# Patient Record
Sex: Male | Born: 1955
Health system: Southern US, Community
[De-identification: ages and names within clinical notes are randomized; demographics above are authoritative.]

## PROBLEM LIST (undated history)

## (undated) DIAGNOSIS — Z87442 Personal history of urinary calculi: Secondary | ICD-10-CM

## (undated) DIAGNOSIS — N200 Calculus of kidney: Secondary | ICD-10-CM

## (undated) DIAGNOSIS — G5600 Carpal tunnel syndrome, unspecified upper limb: Secondary | ICD-10-CM

## (undated) DIAGNOSIS — C61 Malignant neoplasm of prostate: Secondary | ICD-10-CM

## (undated) DIAGNOSIS — Z972 Presence of dental prosthetic device (complete) (partial): Secondary | ICD-10-CM

## (undated) DIAGNOSIS — C4490 Unspecified malignant neoplasm of skin, unspecified: Secondary | ICD-10-CM

## (undated) HISTORY — PX: PROSTATE BIOPSY: SHX241

## (undated) HISTORY — PX: SKIN CANCER EXCISION: SHX779

## (undated) HISTORY — PX: COLONOSCOPY: SHX174

---

## 1999-05-22 ENCOUNTER — Emergency Department (HOSPITAL_COMMUNITY): Admission: EM | Admit: 1999-05-22 | Discharge: 1999-05-22 | Payer: Self-pay | Admitting: *Deleted

## 1999-05-22 ENCOUNTER — Encounter: Payer: Self-pay | Admitting: Emergency Medicine

## 2001-03-01 ENCOUNTER — Emergency Department (HOSPITAL_COMMUNITY): Admission: EM | Admit: 2001-03-01 | Discharge: 2001-03-01 | Payer: Self-pay | Admitting: *Deleted

## 2001-03-01 ENCOUNTER — Encounter: Payer: Self-pay | Admitting: *Deleted

## 2007-10-18 ENCOUNTER — Emergency Department (HOSPITAL_COMMUNITY): Admission: EM | Admit: 2007-10-18 | Discharge: 2007-10-18 | Payer: Self-pay | Admitting: Emergency Medicine

## 2010-11-15 LAB — CBC
Hemoglobin: 14.9
RBC: 5.08
WBC: 16.1 — ABNORMAL HIGH

## 2010-11-15 LAB — BASIC METABOLIC PANEL
BUN: 20
CO2: 25
Calcium: 9.2
Chloride: 105
Creatinine, Ser: 1.63 — ABNORMAL HIGH
GFR calc Af Amer: 40 — ABNORMAL LOW
GFR calc non Af Amer: 33 — ABNORMAL LOW
Glucose, Bld: 142 — ABNORMAL HIGH
Potassium: 3.7
Sodium: 138

## 2010-11-15 LAB — URINALYSIS, ROUTINE W REFLEX MICROSCOPIC
Nitrite: NEGATIVE
Specific Gravity, Urine: >1.03 — ABNORMAL HIGH
Urobilinogen, UA: 0.2
pH: 5.5

## 2010-11-15 LAB — DIFFERENTIAL
Basophils Relative: 1
Lymphocytes Relative: 9 — ABNORMAL LOW
Monocytes Absolute: 0.8
Monocytes Relative: 5
Neutro Abs: 13.7 — ABNORMAL HIGH
Neutrophils Relative %: 85 — ABNORMAL HIGH

## 2010-11-15 LAB — URINE MICROSCOPIC-ADD ON

## 2011-07-13 ENCOUNTER — Emergency Department (INDEPENDENT_AMBULATORY_CARE_PROVIDER_SITE_OTHER): Payer: PRIVATE HEALTH INSURANCE

## 2011-07-13 ENCOUNTER — Emergency Department (HOSPITAL_COMMUNITY)
Admission: EM | Admit: 2011-07-13 | Discharge: 2011-07-13 | Disposition: A | Discharge status: Home / Self Care | Payer: PRIVATE HEALTH INSURANCE | Source: Home / Self Care | Attending: Emergency Medicine | Admitting: Emergency Medicine

## 2011-07-13 ENCOUNTER — Encounter (HOSPITAL_COMMUNITY): Payer: Self-pay | Admitting: Cardiology

## 2011-07-13 DIAGNOSIS — J069 Acute upper respiratory infection, unspecified: Secondary | ICD-10-CM

## 2011-07-13 MED ORDER — GUAIFENESIN-CODEINE 100-10 MG/5ML PO SYRP: 5.0000 mL | ORAL_SOLUTION | Freq: Three times a day (TID) | ORAL | Status: AC | PRN

## 2011-07-13 MED ORDER — ACETAMINOPHEN-CODEINE #3 300-30 MG PO TABS: 1.0000 | ORAL_TABLET | Freq: Four times a day (QID) | ORAL | Status: AC | PRN

## 2011-07-13 MED ORDER — IBUPROFEN 800 MG PO TABS
800.0000 mg | ORAL_TABLET | Freq: Once | ORAL | Status: AC
  Administered 2011-07-13: 800 mg via ORAL

## 2011-07-13 MED ORDER — IBUPROFEN 800 MG PO TABS
ORAL_TABLET | ORAL | Status: AC
  Filled 2011-07-13: qty 1

## 2011-07-13 NOTE — ED Provider Notes (Signed)
History     CSN: [id number]  Arrival date & time 07/13/11  0802   First MD Initiated Contact with Patient 07/13/11 0827      Chief Complaint  Patient presents with  . Fever  . Generalized Body Aches  . Cough    (Consider location/radiation/quality/duration/timing/severity/associated sxs/prior treatment) HPI Comments: Patient is reporting fevers and frequent coughing episodes since Wednesday. Continues to experience fevers over 102-103. Drinking fluids okay his appetite is somewhat gone does have some discomfort and soreness on his lower RIB cage both sides with when he coughs.  Patient without nausea vomiting or diarrhea as.  Patient is a 56 y.o. male presenting with fever and cough.  Fever Primary symptoms of the febrile illness include fever, fatigue, cough and arthralgias. Primary symptoms do not include visual change, wheezing, shortness of breath, abdominal pain, nausea, vomiting or rash. The current episode started 3 to 5 days ago. This is a new problem. The problem has not changed since onset. Cough Pertinent negatives include no shortness of breath and no wheezing.    History reviewed. No pertinent past medical history.  History reviewed. No pertinent past surgical history.  No family history on file.  History  Substance Use Topics  . Smoking status: Current Some Day Smoker -- 0.7 packs/day  . Smokeless tobacco: Not on file  . Alcohol Use: No    OB History    Grav Para Term Preterm Abortions TAB SAB Ect Mult Living                  Review of Systems  Constitutional: Positive for fever and fatigue. Negative for activity change and appetite change.  Respiratory: Positive for cough. Negative for shortness of breath and wheezing.   Gastrointestinal: Negative for nausea, vomiting and abdominal pain.  Musculoskeletal: Positive for arthralgias.  Skin: Negative for rash.    Allergies  Review of patient's allergies indicates no known allergies.  Home  Medications  No current outpatient prescriptions on file.  BP 122/76  Pulse 105  Temp(Src) 101.3 F (38.5 C) (Oral)  Resp 20  SpO2 97%  Physical Exam  Nursing note and vitals reviewed. Constitutional: Vital signs are normal. She appears well-developed and well-nourished.  Non-toxic appearance. She does not have a sickly appearance. She does not appear ill. No distress.  HENT:  Head: Normocephalic.  Pulmonary/Chest: Effort normal and breath sounds normal. No respiratory distress. She has no decreased breath sounds. She has no wheezes. She has no rhonchi. She has no rales. She exhibits no tenderness.    ED Course  Procedures (including critical care time) Patient looks comfortable nondisplaced but febrile. On obtaining x-rays patient is a smoker. Fever control with Motrin  MDM  Patient with respiratory symptoms and process to Wednesday. Not dyspneic.        Jimmie Molly, MD 07/13/11 [phone]

## 2011-07-13 NOTE — ED Notes (Addendum)
Pt reports Wednesday night he developed fever and frequent cough. Fever has been 102-103 responds to Tylenol. Tolerating PO intake. Pt reports soreness to lower rib cage bilat and abd from cough. Last dose of Tylenol at 1am today.

## 2011-07-13 NOTE — Discharge Instructions (Signed)
Your x-rays were normal. As discussed is likely your symptoms are a viral respiratory infection. I not expect it to still be 6 after 2 days try to hydrate yourself well dressed and treat any fevers above 100.8. Return if any further symptoms on his symptoms is one we discussed

## 2011-07-14 ENCOUNTER — Encounter (HOSPITAL_COMMUNITY): Payer: Self-pay | Admitting: Emergency Medicine

## 2011-07-14 ENCOUNTER — Emergency Department (HOSPITAL_COMMUNITY)
Admission: EM | Admit: 2011-07-14 | Discharge: 2011-07-14 | Disposition: A | Discharge status: Home / Self Care | Payer: PRIVATE HEALTH INSURANCE | Attending: Emergency Medicine | Admitting: Emergency Medicine

## 2011-07-14 ENCOUNTER — Emergency Department (HOSPITAL_COMMUNITY): Payer: PRIVATE HEALTH INSURANCE

## 2011-07-14 DIAGNOSIS — R509 Fever, unspecified: Secondary | ICD-10-CM | POA: Insufficient documentation

## 2011-07-14 DIAGNOSIS — R05 Cough: Secondary | ICD-10-CM | POA: Insufficient documentation

## 2011-07-14 DIAGNOSIS — R059 Cough, unspecified: Secondary | ICD-10-CM | POA: Insufficient documentation

## 2011-07-14 DIAGNOSIS — J209 Acute bronchitis, unspecified: Secondary | ICD-10-CM

## 2011-07-14 LAB — DIFFERENTIAL
Basophils Relative: 0 % (ref 0–1)
Eosinophils Absolute: 0 10*3/uL (ref 0.0–0.7)
Lymphocytes Relative: 7 % — ABNORMAL LOW (ref 12–46)
Monocytes Absolute: 0.8 10*3/uL (ref 0.1–1.0)
Neutrophils Relative %: 83 % — ABNORMAL HIGH (ref 43–77)
Smear Review: ADEQUATE

## 2011-07-14 MED ORDER — SODIUM CHLORIDE 0.9 % IV BOLUS (SEPSIS)
1000.0000 mL | Freq: Once | INTRAVENOUS | Status: AC
  Administered 2011-07-14: 1000 mL via INTRAVENOUS

## 2011-07-14 MED ORDER — DEXTROSE 5 % IV SOLN
1.0000 g | Freq: Once | INTRAVENOUS | Status: AC
  Administered 2011-07-14: 1 g via INTRAVENOUS
  Filled 2011-07-14: qty 10

## 2011-07-14 MED ORDER — ACETAMINOPHEN 500 MG PO TABS
1000.0000 mg | ORAL_TABLET | Freq: Once | ORAL | Status: AC
  Administered 2011-07-14: 1000 mg via ORAL
  Filled 2011-07-14: qty 2

## 2011-07-14 MED ORDER — AZITHROMYCIN 250 MG PO TABS: ORAL_TABLET | ORAL | Status: AC

## 2011-07-14 NOTE — Discharge Instructions (Signed)
Acute Bronchitis You have acute bronchitis. This means you have a chest cold. The airways in your lungs are red and sore (inflamed). Acute means it is sudden onset.  CAUSES Bronchitis is most often caused by the same virus that causes a cold. SYMPTOMS   Body aches.   Chest congestion.   Chills.   Cough.   Fever.   Shortness of breath.   Sore throat.  TREATMENT  Acute bronchitis is usually treated with rest, fluids, and medicines for relief of fever or cough. Most symptoms should go away after a few days or a week. Increased fluids may help thin your secretions and will prevent dehydration. Your caregiver may give you an inhaler to improve your symptoms. The inhaler reduces shortness of breath and helps control cough. You can take over-the-counter pain relievers or cough medicine to decrease coughing, pain, or fever. A cool-air vaporizer may help thin bronchial secretions and make it easier to clear your chest. Antibiotics are usually not needed but can be prescribed if you smoke, are seriously ill, have chronic lung problems, are elderly, or you are at higher risk for developing complications.Allergies and asthma can make bronchitis worse. Repeated episodes of bronchitis may cause longstanding lung problems. Avoid smoking and secondhand smoke.Exposure to cigarette smoke or irritating chemicals will make bronchitis worse. If you are a cigarette smoker, consider using nicotine gum or skin patches to help control withdrawal symptoms. Quitting smoking will help your lungs heal faster. Recovery from bronchitis is often slow, but you should start feeling better after 2 to 3 days. Cough from bronchitis frequently lasts for 3 to 4 weeks. To prevent another bout of acute bronchitis:  Quit smoking.   Wash your hands frequently to get rid of viruses or use a hand sanitizer.   Avoid other people with cold or virus symptoms.   Try not to touch your hands to your mouth, nose, or eyes.  SEEK  IMMEDIATE MEDICAL CARE IF:  You develop increased fever, chills, or chest pain.   You have severe shortness of breath or bloody sputum.   You develop dehydration, fainting, repeated vomiting, or a severe headache.   You have no improvement after 1 week of treatment or you get worse.  MAKE SURE YOU:   Understand these instructions.   Will watch your condition.   Will get help right away if you are not doing well or get worse.  Document Released: 03/08/2004 Document Revised: 01/18/2011 Document Reviewed: 05/24/2010 [facility] Patient Information 2012 [place], [place].   Start your Zithromax today.  Rest and make sure you're drinking plenty of fluids.  Treat your fever with Tylenol every 6 hours, you can take Motrin 3 hours between your Tylenol doses if your fever remains.  Please get rechecked if you are not feeling better or if your symptoms worsen in any way over the next several days.  See the resource guide below for information regarding a primary medical doctor.  Stop smoking.    RESOURCE GUIDE  Chronic Pain Problems: Contact [facility]  [phone] Patients need to be referred by their primary care doctor.  Insufficient Money for Medicine: Contact United Way:  call "211" or [facility] [phone].  No Primary Care Doctor: - Call [facility]  [phone] - can help you locate a primary care doctor that  accepts your insurance, provides certain services, etc. - Physician Referral [phone]  Agencies that provide inexpensive medical care: - [facility]  [phone] - Patrcia Dolly  [facility]  [phone] - [facility]  [phone] - Women's Clinic  [phone] - Planned Parenthood  [phone] [facility]  [phone]  Medicaid-accepting [facility] Providers: - [facility]- [address]  [phone], Mon-Fri 9am-7pm, Sat 9am-1pm - [facility]- [address] [place], [address] [place]  [phone] - [facility]- [address], [address] [place]  [phone] [facility]- [address]  [phone] - Renaye Rakers- [address] [place], [address], [phone]  Only accepts [place] patients after they have their name  applied to their card  Self Pay (no insurance) in [place]: - Sickle Cell Patients: Dr Willey Blade, [facility]  [address] [place], [phone] - [facility]- [address] [place]  [phone]       [facility]- [address]       -     [facility]- see information above (Speak to [employer] if you do not have insurance)       -  [facility]- [address] [place], [phone]       -  [facility]- [address],  [phone]       -  [facility]- [address] [phone]       -  Dr Julio Sicks-  [address], [place], [phone]       -  [facility]- [address], [phone]       -  [facility]- [address] [phone], also [address] [phone]       -    [facility]- [address] [place], [phone], 1st & 3rd Saturday   every month, 10am-1pm  1) Find a Doctor and Pay Out of Pocket Although you won't have to find out who is covered by your insurance plan, it is a good idea to ask around and get recommendations. You will then need to call the office and see if the doctor you have chosen will accept you as a new patient and what types of options they offer for patients who are self-pay. Some doctors offer discounts or will set up payment plans for their patients who do not have insurance, but you will need to ask so you aren't surprised when you get to your appointment.  2) Contact Your Local Health Department Not all health departments have doctors that can see patients for sick visits, but many do, so it is worth a  call to see if yours does. If you don't know where your local health department is, you can check in your phone book. The CDC also has a tool to help you locate your state's health department, and many state websites also have listings of all of their local health departments.  3) Find a Walk-in Clinic If your illness is not likely to be very severe or complicated, you may want to try a walk in clinic. These are popping up all over the country in pharmacies, drugstores, and shopping centers. They're usually staffed by nurse practitioners or physician assistants that have been trained to treat common illnesses and complaints. They're usually fairly quick and inexpensive. However, if you have serious medical issues or chronic medical problems, these are probably not  your best option  STD Testing - [facility] Department of [facility] [place], STD Clinic, [address] [place], phone [phone] or [phone].  Monday - Friday, call for an appointment. [facility] Department of [employer], STD Clinic, [address], [place], phone [phone] or [phone].  Monday - Friday, call for an appointment.  Abuse/Neglect: [facility] Child Abuse Hotline 413-579-0724 [facility] Child Abuse Hotline 206-154-2811 (After Hours)  Emergency Shelter:  [facility] 701-794-2117  Maternity Homes: - [facility] [place] [facility] 475-305-4569 - [facility] [phone]  MRSA Hotline #:   813-299-3237  [facility] Resources  [facility]  [employer] [facility] Dept. [address]                 [address]         [address] [place] [address]  Blondell Reveal Phone:  [phone]                                  Phone:  [phone]                   Phone:  [phone]  [facility],  [phone] - [phone]       -     [facility] in [place], [address]                                  [phone], [facility] Child Abuse Hotline 3645082720 or 856-163-6243 (After Hours)   Behavioral Health Services  Substance Abuse Resources: - [facility] and Drug Services  520-011-2250 - [facility] 442-325-9346 - [facility] [place] [phone] Floydene Flock 803-213-7804 - Residential & Outpatient Substance Abuse Program  [phone]  Psychological Services: [facility]  [phone] Services  [phone] - [facility], [phone] [place]. [address] [place], ACCESS LINE: 437-716-3971 or 564-852-6644, [employer]  Dental Assistance  If unable to pay or uninsured, contact:  [facility] or [facility] to become qualified for the adult dental clinic.  Patients with Medicaid: [facility] [phone] [address], [phone] [address], [phone]  If unable to pay, or uninsured, contact [facility] [phone])719-1274) or [facility] Department [phone] in [place], [phone] in [facility]) to become qualified for the adult dental clinic  Other Low-Cost Community Dental Services: - [facility]- [address] [place], [place], [place], [address], [phone], [phone], 2nd and 4th Thursday of the month at 6:30am.  10 clients each day by appointment, can sometimes see walk-in patients if someone does not show for an appointment. [facility]- [address] [place], [place], [address], [phone] -  [facility]- [address] [place], [place], [address], [phone] - [facility]- [phone] [facility] Department- [phone] [facility] Department- [phone]

## 2011-07-14 NOTE — ED Notes (Signed)
Pt c/o cough and fever 103.8 since Wednesday.

## 2011-07-16 LAB — CBC
Hemoglobin: 13.7 g/dL (ref 13.0–17.0)
MCH: 28.7 pg (ref 26.0–34.0)
MCHC: 34.3 g/dL (ref 30.0–36.0)
Platelets: 208 10*3/uL (ref 150–400)
RBC: 4.77 MIL/uL (ref 4.22–5.81)

## 2011-07-16 LAB — BASIC METABOLIC PANEL
Calcium: 8.7 mg/dL (ref 8.4–10.5)
Creatinine, Ser: 1.13 mg/dL (ref 0.50–1.35)
GFR calc Af Amer: 62 mL/min — ABNORMAL LOW (ref >90)
GFR calc non Af Amer: 53 mL/min — ABNORMAL LOW (ref >90)
Sodium: 131 mEq/L — ABNORMAL LOW (ref 135–145)

## 2011-07-16 NOTE — ED Provider Notes (Signed)
History     CSN: [id number]  Arrival date & time 07/14/11  4098   First MD Initiated Contact with Patient 07/14/11 0957      Chief Complaint  Patient presents with  . Cough  . Fever    (Consider location/radiation/quality/duration/timing/severity/associated sxs/prior treatment) HPI Comments: JERRELL MANGEL presents for evaluation of productive cough and fever for the past 3 days.  He has had fever to 103.8 which has been waxing and waning.  He has green purulent sputum without hemoptysis.  He denies shortness of breath,  But has been very fatigued and has difficulty sleeping due to the frequency of cough.  He was [facility] and placed on tylenol #3 and robitussin AC but feels no better.  He reports bilateral anterior rib soreness from coughing.  He denies nausea,  Vomiting and abdominal pain.He has been maintaining fluid intake, but has had reduced appetite.  Patient is a 56 y.o. male presenting with cough and fever. The history is provided by the patient and the spouse.  Cough Pertinent negatives include no chest pain, no headaches, no sore throat and no shortness of breath.  Fever Primary symptoms of the febrile illness include fever and cough. Primary symptoms do not include headaches, shortness of breath, abdominal pain, nausea, arthralgias or rash.    History reviewed. No pertinent past medical history.  History reviewed. No pertinent past surgical history.  History reviewed. No pertinent family history.  History  Substance Use Topics  . Smoking status: Current Some Day Smoker -- 0.7 packs/day    Types: Cigarettes  . Smokeless tobacco: Not on file  . Alcohol Use: No      Review of Systems  Constitutional: Positive for fever.  HENT: Negative for congestion, sore throat and neck pain.   Eyes: Negative.   Respiratory: Positive for cough. Negative for chest tightness and shortness of breath.   Cardiovascular: Negative for chest pain and palpitations.    Gastrointestinal: Negative for nausea and abdominal pain.  Genitourinary: Negative.   Musculoskeletal: Negative for joint swelling and arthralgias.  Skin: Negative.  Negative for rash and wound.  Neurological: Negative for dizziness, weakness, light-headedness, numbness and headaches.  Hematological: Negative.   Psychiatric/Behavioral: Negative.     Allergies  Review of patient's allergies indicates no known allergies.  Home Medications   Current Outpatient Rx  Name Route Sig Dispense Refill  . ACETAMINOPHEN-CODEINE #3 300-30 MG PO TABS Oral Take 1-2 tablets by mouth every 6 (six) hours as needed for pain. 15 tablet 0  . GUAIFENESIN-CODEINE 100-10 MG/5ML PO SYRP Oral Take 5 mLs by mouth 3 (three) times daily as needed for cough. 120 mL 0  . IBUPROFEN 200 MG PO TABS Oral Take 400-800 mg by mouth every 4 (four) hours as needed. For fever and pain    . AZITHROMYCIN 250 MG PO TABS  Take 2 tablets by mouth on day one followed by one tablet daily for 4 days. 6 each 0    BP 125/71  Pulse 102  Temp(Src) 100 F (37.8 C) (Oral)  Resp 20  Ht 5\' 10"  (1.778 m)  Wt 165 lb (74.844 kg)  BMI 23.68 kg/m2  SpO2 94%  Physical Exam  Nursing note and vitals reviewed. Constitutional: He appears well-developed and well-nourished.  HENT:  Head: Normocephalic and atraumatic.  Eyes: Conjunctivae are normal.  Neck: Normal range of motion.  Cardiovascular: Normal rate, regular rhythm, normal heart sounds and intact distal pulses.   Pulmonary/Chest: Effort normal and  breath sounds normal. No respiratory distress. He has no wheezes. He has no rhonchi. He has no rales. He exhibits tenderness.  Abdominal: Soft. Bowel sounds are normal. There is no tenderness.  Musculoskeletal: Normal range of motion.  Neurological: He is alert.  Skin: Skin is warm and dry.  Psychiatric: He has a normal mood and affect.    ED Course  Procedures (including critical care time)  Labs Reviewed  DIFFERENTIAL -  Abnormal; Notable for the following:    Neutrophils Relative 83 (*)    Lymphocytes Relative 7 (*)    Lymphs Abs 0.6 (*)    All other components within normal limits  BASIC METABOLIC PANEL - Abnormal; Notable for the following:    Sodium 131 (*)    Glucose, Bld 107 (*)    Creatinine, Ser 1.13 (*)    GFR calc non Af Amer 53 (*)    GFR calc Af Amer 62 (*)    All other components within normal limits  CBC  LAB REPORT - SCANNED   No results found.   1. Bronchitis, acute    Patient given rocephin 1 gram IV in ed.  Prescribed zithromax,   MDM  Acute bronchitis in a chronic smoker - pt covered with rocephin,  zithromax .  Given IV fluids,  Fever responded appropriately to tylenol.  Encouraged rest, increase fluids.  Tylenol/motrin for fever reduction,  Recheck by pcp or return here for any worsened sx,  Or if not improving with tx.        Burgess Amor, [place] 07/16/11 2312

## 2011-07-17 NOTE — ED Provider Notes (Signed)
Medical screening examination/treatment/procedure(s) were performed by non-physician practitioner and as supervising physician I was immediately available for consultation/collaboration.   Dayton Bailiff, MD 07/17/11 [phone]

## 2013-12-24 ENCOUNTER — Encounter (HOSPITAL_COMMUNITY): Payer: Self-pay | Admitting: Emergency Medicine

## 2013-12-24 ENCOUNTER — Emergency Department (INDEPENDENT_AMBULATORY_CARE_PROVIDER_SITE_OTHER): Payer: Self-pay

## 2013-12-24 ENCOUNTER — Emergency Department (HOSPITAL_COMMUNITY)
Admission: EM | Admit: 2013-12-24 | Discharge: 2013-12-24 | Disposition: A | Discharge status: Home / Self Care | Payer: Self-pay | Source: Home / Self Care | Attending: Family Medicine | Admitting: Family Medicine

## 2013-12-24 DIAGNOSIS — T148 Other injury of unspecified body region: Secondary | ICD-10-CM

## 2013-12-24 DIAGNOSIS — S62639B Displaced fracture of distal phalanx of unspecified finger, initial encounter for open fracture: Secondary | ICD-10-CM

## 2013-12-24 DIAGNOSIS — IMO0002 Reserved for concepts with insufficient information to code with codable children: Secondary | ICD-10-CM

## 2013-12-24 DIAGNOSIS — X58XXXA Exposure to other specified factors, initial encounter: Secondary | ICD-10-CM

## 2013-12-24 MED ORDER — KETOROLAC TROMETHAMINE 60 MG/2ML IM SOLN
60.0000 mg | Freq: Once | INTRAMUSCULAR | Status: AC
  Administered 2013-12-24: 60 mg via INTRAMUSCULAR

## 2013-12-24 MED ORDER — CEPHALEXIN 500 MG PO CAPS: 500.0000 mg | ORAL_CAPSULE | Freq: Three times a day (TID) | ORAL | Status: DC

## 2013-12-24 MED ORDER — LIDOCAINE HCL (PF) 2 % IJ SOLN
INTRAMUSCULAR | Status: AC
  Filled 2013-12-24: qty 2

## 2013-12-24 MED ORDER — KETOROLAC TROMETHAMINE 60 MG/2ML IM SOLN
INTRAMUSCULAR | Status: AC
  Filled 2013-12-24: qty 2

## 2013-12-24 NOTE — ED Notes (Signed)
58 year old male smashed his index finger at approx  11:00am today between a trailer and the Marketing executive.   Laceration to end of the Right index finger.

## 2013-12-24 NOTE — Discharge Instructions (Signed)
You have a tuft fracture of your finger.  The laceration was repaired with 2 deep sutures that will dissolve and 10 superficial sutures that will need to be removed in 14 days Until that time please wash your finger with soap and water daily and apply antibiotic ointment and a bandage until the stitches come out Please take the antibiotics Please wear the finger brace for the next 4-6 weeks. Than let pain be your guide on further wearing of the brace.

## 2013-12-24 NOTE — ED Provider Notes (Signed)
CSN: [id number]     Arrival date & time 12/24/13  1537 History   First MD Initiated Contact with Patient 12/24/13 1612     Chief Complaint  Patient presents with  . Extremity Laceration   (Consider location/radiation/quality/duration/timing/severity/associated sxs/prior Treatment) HPI  R index finger laceration and crush injury: 11:30 today pt was unhooking trailer from truck and smashed finger inside [employer]. Heavy load in trailer. Immediately painful and bleeding. Applied abx ointment. 1000mg  tylenol w/o much relief. Movement and sensation intact.   History reviewed. No pertinent past medical history. History reviewed. No pertinent past surgical history. History reviewed. No pertinent family history. History  Substance Use Topics  . Smoking status: Current Some Day Smoker -- 0.75 packs/day    Types: Cigarettes  . Smokeless tobacco: Not on file  . Alcohol Use: No    Review of Systems Per HPI with all other pertinent systems negative.   Allergies  Review of patient's allergies indicates no known allergies.  Home Medications   Prior to Admission medications   Medication Sig Start Date End Date Taking? Authorizing Provider  cephALEXin (KEFLEX) 500 MG capsule Take 1 capsule (500 mg total) by mouth 3 (three) times daily. 12/24/13   Waldemar Dickens, MD  ibuprofen (ADVIL,MOTRIN) 200 MG tablet Take 400-800 mg by mouth every 4 (four) hours as needed. For fever and pain    Historical Provider, MD   BP 123/79 mmHg  Pulse 78  Temp(Src) 97.2 F (36.2 C) (Oral)  Resp 16  SpO2 100% Physical Exam  Constitutional: He appears well-developed and well-nourished.  HENT:  Head: Normocephalic and atraumatic.  Eyes: EOM are normal. Pupils are equal, round, and reactive to light.  Neck: Normal range of motion.  Cardiovascular: Normal rate, normal heart sounds and intact distal pulses.   No murmur heard. Pulmonary/Chest: Effort normal and breath sounds normal.  Abdominal: Soft. Bowel  sounds are normal.  Musculoskeletal: Normal range of motion.  Index finger w/ complete flexion and extension  Neurological: He is alert.  Skin:  Large 3.8cm laceration along the volar and lateral aspect of the R index finger and medial subungual hematoma  (anterilor 1/4th)   Psychiatric: He has a normal mood and affect. His behavior is normal. Judgment and thought content normal.    ED Course  LACERATION REPAIR Date/Time: 12/24/2013 6:01 PM Performed by: Marily Memos, DAVID J Authorized by: Marily Memos, DAVID J Consent: Verbal consent obtained. Risks and benefits: risks, benefits and alternatives were discussed Consent given by: patient Patient identity confirmed: verbally with patient Body area: upper extremity Location details: right index finger Laceration length: 3.8 cm Contamination: The wound is contaminated. Foreign bodies: no foreign bodies Tendon involvement: none Nerve involvement: none Vascular damage: no Anesthesia: digital block Local anesthetic: lidocaine 1% without epinephrine Anesthetic total: 5 ml Patient sedated: no Preparation: Patient was prepped and draped in the usual sterile fashion. Irrigation solution: saline (betadine) Amount of cleaning: extensive Debridement: minimal Degree of undermining: none Skin closure: 3-0 Prolene Subcutaneous closure: 3-0 Vicryl Number of sutures: 10 Technique: simple Approximation: close Approximation difficulty: complex Dressing: antibiotic ointment and gauze roll Patient tolerance: Patient tolerated the procedure well with no immediate complications   (including critical care time) Labs Review Labs Reviewed - No data to display  Imaging Review Dg Hand Complete Right  12/24/2013   CLINICAL DATA:  Per pt: unhooking a trailer and smashed right index finger. No prior injury to the right hand. Right index finger has a laceration distally. Patient is not a  diabetic.  EXAM: RIGHT HAND - COMPLETE 3+ VIEW  COMPARISON:  None.   FINDINGS: There is a comminuted fracture of the tuft of the distal phalanx of the index finger. Associated soft tissue irregularity is noted. There is soft tissue edema without obvious radiopaque foreign body. No other fractures. Degenerative change in distal interphalangeal joint of the third digit.  IMPRESSION: Soft tissue laceration associated with tuft fracture of the distal phalanx of the index finger.   Electronically Signed   By: Shon Hale M.D.   On: 12/24/2013 16:31     MDM   1. Open fracture of tuft of distal phalanx of finger, initial encounter   2. Crush accident   3. Laceration    Tuft fracture and laceration Repair as above Brace for 4-6 wks  Toradol 60mg  IM in office Ice, elevation, and NSAIDs (start in 24 hrs) Precautions given and all questions answered Keflex x 7 days  Linna Darner, MD Family Medicine 12/24/2013, 6:03 PM       Waldemar Dickens, MD 12/24/13 [phone]

## 2014-01-08 ENCOUNTER — Emergency Department (INDEPENDENT_AMBULATORY_CARE_PROVIDER_SITE_OTHER)
Admission: EM | Admit: 2014-01-08 | Discharge: 2014-01-08 | Disposition: A | Discharge status: Home / Self Care | Payer: Self-pay | Source: Home / Self Care | Attending: Emergency Medicine | Admitting: Emergency Medicine

## 2014-01-08 ENCOUNTER — Encounter (HOSPITAL_COMMUNITY): Payer: Self-pay | Admitting: Emergency Medicine

## 2014-01-08 DIAGNOSIS — IMO0002 Reserved for concepts with insufficient information to code with codable children: Secondary | ICD-10-CM

## 2014-01-08 DIAGNOSIS — S62639D Displaced fracture of distal phalanx of unspecified finger, subsequent encounter for fracture with routine healing: Secondary | ICD-10-CM

## 2014-01-08 DIAGNOSIS — T148XXA Other injury of unspecified body region, initial encounter: Secondary | ICD-10-CM

## 2014-01-08 DIAGNOSIS — T148 Other injury of unspecified body region: Secondary | ICD-10-CM

## 2014-01-08 MED ORDER — LIDOCAINE HCL (PF) 2 % IJ SOLN
INTRAMUSCULAR | Status: AC
  Filled 2014-01-08: qty 2

## 2014-01-08 NOTE — Discharge Instructions (Signed)
Wash with soap and water and apply antibiotic ointment and Band Aid.  Continue to wear splint.

## 2014-01-08 NOTE — ED Provider Notes (Signed)
  Chief Complaint   Suture / Staple Removal   History of Present Illness   [name] is a 58 year old male who returns today for recheck on a laceration to the tip of his right index finger. He sustained a crush injury to the finger. This resulted in a tuft fracture and laceration, therefore this was an open fracture. He also had a subungual hematoma. The subungual hematoma was drained, the laceration was repaired with 10 sutures, and he was placed on Keflex. He's done well since then. This was 2 weeks ago, so the sutures are ready to come out. He denies any swelling. The tip of the finger is still sensitive.  Review of Systems   Other than as noted above, the patient denies any of the following symptoms: Systemic:  No fevers or chills. Musculoskeletal:  No joint pain or arthritis.  Neurological:  No muscular weakness or paresthesias.  [name]   Past medical history, family history, social history, meds, and allergies were reviewed.     Physical Examination   Vital signs:  BP 138/73 mmHg  Pulse 70  Temp(Src) 97.9 F (36.6 C) (Oral)  Resp 16  SpO2 98% Gen:  Alert and in no distress. Musculoskeletal:  Exam of the hand reveals he has a sutured laceration on the tip of the right index finger with 10 sutures in place. It appears to be healing well with no evidence of infection. The tip of the finger still very sensitive and painful to touch, but there is no swelling, erythema, or visible collection of pus.  Otherwise, all joints had a full a ROM with no swelling, bruising or deformity.  No edema, pulses full. Extremities were warm and pink.  Capillary refill was brisk.  Skin:  Clear, warm and dry.  No rash. Neuro:  Alert and oriented.  Muscle strength was normal.  Sensation was intact to light touch.   Course in Urgent [facility] Center   I attempted to remove the sutures without anesthesia, but he was just too sensitive on the tip of the finger. Therefore the finger was anesthetized  with a digital block with 5 mL of 2% Xylocaine with epinephrine. Thereafter he had complete anesthesia in the tip of the finger and all the stitches could be removed. This was then cleansed and covered with an tobacco ointment and a Band-Aid dressing. He's to continue on with wound care, where his finger splint, and return again in a month for one final recheck.  Assessment   The primary encounter diagnosis was Laceration. Diagnoses of Crush injury and Open fracture of finger, distal phalanx, with routine healing, subsequent encounter were also pertinent to this visit.  Plan  1.  Meds:  The following meds were prescribed:   Discharge Medication List as of 01/08/2014  4:10 PM      2.  Patient Education/Counseling:  The patient was given appropriate handouts, self care instructions, and instructed in symptomatic relief, including rest and activity, and elevation.   3.  Follow up:  The patient was told to follow up here if no better in 3 to 4 days, or sooner if becoming worse in any way, and given some red flag symptoms such as worsening pain, fever, swelling, or neurological symptoms which would prompt immediate return.        Harden Mo, MD 01/08/14 [phone]

## 2014-01-08 NOTE — ED Notes (Signed)
Patient presents for suture removal 2 weeks post procedure. Patient denies any new changes. He is alert and oriented and in NAD.

## 2014-03-17 ENCOUNTER — Emergency Department (HOSPITAL_COMMUNITY)
Admission: EM | Admit: 2014-03-17 | Discharge: 2014-03-17 | Disposition: A | Discharge status: Home / Self Care | Payer: 59 | Attending: Emergency Medicine | Admitting: Emergency Medicine

## 2014-03-17 ENCOUNTER — Emergency Department (HOSPITAL_COMMUNITY): Payer: 59

## 2014-03-17 ENCOUNTER — Encounter (HOSPITAL_COMMUNITY): Payer: Self-pay | Admitting: *Deleted

## 2014-03-17 DIAGNOSIS — Z720 Tobacco use: Secondary | ICD-10-CM | POA: Insufficient documentation

## 2014-03-17 DIAGNOSIS — N200 Calculus of kidney: Secondary | ICD-10-CM | POA: Insufficient documentation

## 2014-03-17 DIAGNOSIS — R109 Unspecified abdominal pain: Secondary | ICD-10-CM | POA: Diagnosis present

## 2014-03-17 DIAGNOSIS — Z792 Long term (current) use of antibiotics: Secondary | ICD-10-CM | POA: Diagnosis not present

## 2014-03-17 LAB — URINALYSIS, ROUTINE W REFLEX MICROSCOPIC
BILIRUBIN URINE: NEGATIVE
Glucose, UA: NEGATIVE mg/dL
KETONES UR: NEGATIVE mg/dL
Leukocytes, UA: NEGATIVE
NITRITE: NEGATIVE
PROTEIN: NEGATIVE mg/dL
Specific Gravity, Urine: 1.02 (ref 1.005–1.030)
UROBILINOGEN UA: 0.2 mg/dL (ref 0.0–1.0)
pH: 5.5 (ref 5.0–8.0)

## 2014-03-17 LAB — URINE MICROSCOPIC-ADD ON

## 2014-03-17 MED ORDER — KETOROLAC TROMETHAMINE 10 MG PO TABS: 10.0000 mg | ORAL_TABLET | Freq: Four times a day (QID) | ORAL | Status: DC | PRN

## 2014-03-17 MED ORDER — KETOROLAC TROMETHAMINE 30 MG/ML IJ SOLN
30.0000 mg | Freq: Once | INTRAMUSCULAR | Status: AC
  Administered 2014-03-17: 30 mg via INTRAVENOUS
  Filled 2014-03-17: qty 1

## 2014-03-17 MED ORDER — MORPHINE SULFATE 4 MG/ML IJ SOLN
4.0000 mg | Freq: Once | INTRAMUSCULAR | Status: AC
  Administered 2014-03-17: 4 mg via INTRAVENOUS
  Filled 2014-03-17: qty 1

## 2014-03-17 MED ORDER — SODIUM CHLORIDE 0.9 % IV BOLUS (SEPSIS)
500.0000 mL | Freq: Once | INTRAVENOUS | Status: AC
  Administered 2014-03-17: 500 mL via INTRAVENOUS

## 2014-03-17 MED ORDER — ONDANSETRON HCL 4 MG/2ML IJ SOLN
4.0000 mg | Freq: Once | INTRAMUSCULAR | Status: AC
  Administered 2014-03-17: 4 mg via INTRAVENOUS
  Filled 2014-03-17: qty 2

## 2014-03-17 MED ORDER — OXYCODONE-ACETAMINOPHEN 5-325 MG PO TABS: 2.0000 | ORAL_TABLET | ORAL | Status: DC | PRN

## 2014-03-17 NOTE — ED Notes (Addendum)
Pt reporting last week was diagnosed with kidney stones.  Reporting increased pain in past 24 hours.  Reporting decrease in urination.

## 2014-03-17 NOTE — Discharge Instructions (Signed)
Refill for pain prescription and Toradol which is also helpful for pain.  Referral to urologist. Phone number given.

## 2014-03-17 NOTE — ED Notes (Signed)
Patient verbalizes understanding of discharge instructions, prescription medications, home care and follow up care. Patient ambulatory out of department at this time with family member. 

## 2014-03-18 NOTE — ED Provider Notes (Signed)
CSN: [id number]     Arrival date & time 03/17/14  1936 History   First MD Initiated Contact with Patient 03/17/14 2049     Chief Complaint  Patient presents with  . Nephrolithiasis     (Consider location/radiation/quality/duration/timing/severity/associated sxs/prior Treatment) HPI.... Patient with known history of kidney stones presents with persistent left flank pain for several days. He was seen in the [facility] emergency department and diagnosed via CT scan. No fever, chills, dysuria, hematuria. He has been taking Percocet for pain. He has not seen a urologist.  Past Medical History  Diagnosis Date  . Renal disorder    History reviewed. No pertinent past surgical history. History reviewed. No pertinent family history. History  Substance Use Topics  . Smoking status: Current Some Day Smoker -- 0.75 packs/day    Types: Cigarettes  . Smokeless tobacco: Not on file  . Alcohol Use: No    Review of Systems  All other systems reviewed and are negative.     Allergies  Review of patient's allergies indicates no known allergies.  Home Medications   Prior to Admission medications   Medication Sig Start Date End Date Taking? Authorizing Provider  cyclobenzaprine (FLEXERIL) 10 MG tablet Take 10 mg by mouth 3 (three) times daily as needed for muscle spasms.   Yes Historical Provider, MD  ibuprofen (ADVIL,MOTRIN) 200 MG tablet Take 400-800 mg by mouth every 4 (four) hours as needed. For fever and pain   Yes Historical Provider, MD  ondansetron (ZOFRAN-ODT) 4 MG disintegrating tablet Take 4 mg by mouth every 6 (six) hours as needed for nausea or vomiting.   Yes Historical Provider, MD  tamsulosin (FLOMAX) 0.4 MG CAPS capsule Take 0.4 mg by mouth.   Yes Historical Provider, MD  cephALEXin (KEFLEX) 500 MG capsule Take 1 capsule (500 mg total) by mouth 3 (three) times daily. 12/24/13   Waldemar Dickens, MD  ketorolac (TORADOL) 10 MG tablet Take 1 tablet (10 mg total) by  mouth every 6 (six) hours as needed for moderate pain. 03/17/14   Nat Christen, MD  oxyCODONE-acetaminophen (PERCOCET) 5-325 MG per tablet Take 2 tablets by mouth every 4 (four) hours as needed. 03/17/14   Nat Christen, MD   BP 121/81 mmHg  Pulse 79  Temp(Src) 98 F (36.7 C) (Oral)  Resp 18  Ht 5\' 9"  (1.753 m)  Wt 160 lb (72.576 kg)  BMI 23.62 kg/m2  SpO2 98% Physical Exam  Constitutional: He is oriented to person, place, and time. He appears well-developed and well-nourished.  HENT:  Head: Normocephalic and atraumatic.  Eyes: Conjunctivae and EOM are normal. Pupils are equal, round, and reactive to light.  Neck: Normal range of motion. Neck supple.  Cardiovascular: Normal rate and regular rhythm.   Pulmonary/Chest: Effort normal and breath sounds normal.  Abdominal: Soft. Bowel sounds are normal.  Genitourinary:  Minimal left flank tenderness  Musculoskeletal: Normal range of motion.  Neurological: He is alert and oriented to person, place, and time.  Skin: Skin is warm and dry.  Psychiatric: He has a normal mood and affect. His behavior is normal.  Nursing note and vitals reviewed.   ED Course  Procedures (including critical care time) Labs Review Labs Reviewed  URINALYSIS, ROUTINE W REFLEX MICROSCOPIC - Abnormal; Notable for the following:    Hgb urine dipstick SMALL (*)    All other components within normal limits  URINE MICROSCOPIC-ADD ON    Imaging Review Dg Abd 1 View  03/17/2014   CLINICAL  DATA:  Nephrolithiasis. Left-sided abdominal pain for 3 days pre decreased urination. History of kidney stones. Initial encounter.  EXAM: ABDOMEN - 1 VIEW  COMPARISON:  CT 10/18/2007  FINDINGS: Multiple calcifications in the left upper quadrant of the abdomen corresponds to splenic granulomas on prior CT. No definite calcifications project over the renal shadow. There are pelvic calcifications, right greater than left, correspond to phleboliths on prior CT. No definite stones over the  course of either ureter. A 13 mm calcification in the right upper quadrant the abdomen is likely a gallstone. Mild gaseous distention of transverse colon, which is otherwise normal in caliber. There are no dilated bowel loops to suggest obstruction. There is no evidence of free intra-abdominal air. No acute osseous abnormalities are seen.  IMPRESSION: 1. No definite urolithiasis identified on radiograph. 2. There are multiple calcifications in the abdomen. Left upper quadrant calcifications correspond to splenic granulomas on prior CT. Pelvic calcifications correspond to phleboliths on prior CT. The right upper quadrant calcification is likely calcified gallstone that has increased in size in the interim.   Electronically Signed   By: Jeb Levering M.D.   On: 03/17/2014 21:59     EKG Interpretation None      MDM   Final diagnoses:  Kidney stone on left side    Asian feels much better after pain management. I did not repeat the CT scan secondary to radiation risk exposure. Plain films of abdomen show no obvious stone. These findings were discussed with the patient and his significant other. Referral to urologist. Discharge medications Percocet 06/15/2023 and Toradol 10 by mouth. Patient has prescriptions for Flomax and Zofran.    Nat Christen, MD 03/18/14 1146

## 2015-04-12 ENCOUNTER — Encounter (HOSPITAL_COMMUNITY): Payer: Self-pay

## 2015-04-12 ENCOUNTER — Emergency Department (HOSPITAL_COMMUNITY)
Admission: EM | Admit: 2015-04-12 | Discharge: 2015-04-12 | Disposition: A | Discharge status: Home / Self Care | Payer: BLUE CROSS/BLUE SHIELD | Attending: Emergency Medicine | Admitting: Emergency Medicine

## 2015-04-12 ENCOUNTER — Emergency Department (HOSPITAL_COMMUNITY): Payer: BLUE CROSS/BLUE SHIELD

## 2015-04-12 DIAGNOSIS — J101 Influenza due to other identified influenza virus with other respiratory manifestations: Secondary | ICD-10-CM

## 2015-04-12 DIAGNOSIS — Z87891 Personal history of nicotine dependence: Secondary | ICD-10-CM | POA: Insufficient documentation

## 2015-04-12 DIAGNOSIS — R05 Cough: Secondary | ICD-10-CM | POA: Diagnosis present

## 2015-04-12 HISTORY — DX: Calculus of kidney: N20.0

## 2015-04-12 LAB — CBC WITH DIFFERENTIAL/PLATELET
Basophils Absolute: 0 10*3/uL (ref 0.0–0.1)
Basophils Relative: 0 %
EOS ABS: 0.1 10*3/uL (ref 0.0–0.7)
Eosinophils Relative: 1 %
HEMATOCRIT: 42.6 % (ref 39.0–52.0)
HEMOGLOBIN: 14.4 g/dL (ref 13.0–17.0)
LYMPHS ABS: 0.8 10*3/uL (ref 0.7–4.0)
LYMPHS PCT: 8 %
MCH: 28.6 pg (ref 26.0–34.0)
MCHC: 33.8 g/dL (ref 30.0–36.0)
MCV: 84.5 fL (ref 78.0–100.0)
Monocytes Absolute: 1 10*3/uL (ref 0.1–1.0)
Monocytes Relative: 9 %
NEUTROS PCT: 82 %
Neutro Abs: 9.1 10*3/uL — ABNORMAL HIGH (ref 1.7–7.7)
Platelets: 237 10*3/uL (ref 150–400)
RBC: 5.04 MIL/uL (ref 4.22–5.81)
RDW: 13.3 % (ref 11.5–15.5)
WBC: 11 10*3/uL — ABNORMAL HIGH (ref 4.0–10.5)

## 2015-04-12 LAB — URINALYSIS, ROUTINE W REFLEX MICROSCOPIC
Bilirubin Urine: NEGATIVE
Glucose, UA: NEGATIVE mg/dL
HGB URINE DIPSTICK: NEGATIVE
Ketones, ur: NEGATIVE mg/dL
LEUKOCYTES UA: NEGATIVE
Nitrite: NEGATIVE
PROTEIN: NEGATIVE mg/dL
Specific Gravity, Urine: 1.025 (ref 1.005–1.030)
pH: 6 (ref 5.0–8.0)

## 2015-04-12 LAB — TROPONIN I: Troponin I: <0.03 ng/mL (ref <0.031)

## 2015-04-12 LAB — INFLUENZA PANEL BY PCR (TYPE A & B)
H1N1FLUPCR: NOT DETECTED
Influenza A By PCR: NEGATIVE
Influenza B By PCR: POSITIVE — AB

## 2015-04-12 LAB — BASIC METABOLIC PANEL
Anion gap: 11 (ref 5–15)
BUN: 13 mg/dL (ref 6–20)
CO2: 19 mmol/L — AB (ref 22–32)
Calcium: 8.7 mg/dL — ABNORMAL LOW (ref 8.9–10.3)
Chloride: 107 mmol/L (ref 101–111)
Creatinine, Ser: 1.35 mg/dL — ABNORMAL HIGH (ref 0.61–1.24)
GFR calc Af Amer: >60 mL/min (ref >60)
GFR calc non Af Amer: 56 mL/min — ABNORMAL LOW (ref >60)
GLUCOSE: 116 mg/dL — AB (ref 65–99)
POTASSIUM: 3.7 mmol/L (ref 3.5–5.1)
Sodium: 137 mmol/L (ref 135–145)

## 2015-04-12 LAB — LACTIC ACID, PLASMA: Lactic Acid, Venous: 0.8 mmol/L (ref 0.5–2.0)

## 2015-04-12 MED ORDER — SODIUM CHLORIDE 0.9 % IV BOLUS (SEPSIS)
1000.0000 mL | Freq: Once | INTRAVENOUS | Status: AC
  Administered 2015-04-12: 1000 mL via INTRAVENOUS

## 2015-04-12 MED ORDER — SODIUM CHLORIDE 0.9 % IV SOLN
INTRAVENOUS | Status: DC
  Administered 2015-04-12: 20:00:00 via INTRAVENOUS

## 2015-04-12 MED ORDER — BENZONATATE 100 MG PO CAPS: 100.0000 mg | ORAL_CAPSULE | Freq: Three times a day (TID) | ORAL | Status: DC | PRN

## 2015-04-12 MED ORDER — SODIUM CHLORIDE 0.9 % IV BOLUS (SEPSIS)
1000.0000 mL | Freq: Once | INTRAVENOUS | Status: AC
Start: 2015-04-12 — End: 2015-04-12
  Administered 2015-04-12: 1000 mL via INTRAVENOUS

## 2015-04-12 NOTE — Discharge Instructions (Signed)
°Emergency Department Resource Guide °1) Find a Doctor and Pay Out of Pocket °Although you won't have to find out who is covered by your insurance plan, it is a good idea to ask around and get recommendations. You will then need to call the office and see if the doctor you have chosen will accept you as a new patient and what types of options they offer for patients who are self-pay. Some doctors offer discounts or will set up payment plans for their patients who do not have insurance, but you will need to ask so you aren't surprised when you get to your appointment. ° °2) Contact Your Local Health Department °Not all health departments have doctors that can see patients for sick visits, but many do, so it is worth a call to see if yours does. If you don't know where your local health department is, you can check in your phone book. The CDC also has a tool to help you locate your state's health department, and many state websites also have listings of all of their local health departments. ° °3) Find a Walk-in Clinic °If your illness is not likely to be very severe or complicated, you may want to try a walk in clinic. These are popping up all over the country in pharmacies, drugstores, and shopping centers. They're usually staffed by nurse practitioners or physician assistants that have been trained to treat common illnesses and complaints. They're usually fairly quick and inexpensive. However, if you have serious medical issues or chronic medical problems, these are probably not your best option. ° °No Primary Care Doctor: °- Call [facility] at  832-8000 - they can help you locate a primary care doctor that  accepts your insurance, provides certain services, etc. °- Physician Referral Service- 1-800-533-3463 ° °Chronic Pain Problems: °Organization         Address  Phone   Notes  °[facility]  ([phone]) [phone] Patients need to be referred by their primary care doctor.  ° °Medication  Assistance: °Organization         Address  Phone   Notes  °[place] [facility] [address] °[place], [place] [address] (336) 641-8030 --Must be a resident of [place] °-- Must have NO insurance coverage whatsoever (no Medicaid/ Medicare, etc.) °-- The pt. MUST have a primary care doctor that directs their care regularly and follows them in the community °  °[facility]  (866) 331-1348   °[employer]  (888) 892-1162   ° °Agencies that provide inexpensive medical care: °Organization         Address  Phone   Notes  °[facility]  (336) 832-8035   ° Internal Medicine    (336) 832-7272   °[facility] [address] °[place], [place] [address] (336) 832-4777   °[facility] [address], °[place] (336) 271-4999   °[facility]    (336) 373-0678   °[facility]    (336) 272-1050   °[facility] ° [address], [place] Phone:  (336) 832-4444, Fax:  (336) 832-4440 Hours of Operation:  9 am - 6 pm, M-F.  Also accepts Medicaid/Medicare and self-pay.  °[facility] ° [address], [place] Phone: (336) [phone], Fax: (336) [phone]. Hours of Operation:  8:30 am - 5:30 pm, M-F.  Also accepts Medicaid and self-pay.  °[facility] [place] [address], [place] Phone: (336) 878-6027   °[facility] [address], [place], [place] (336)723-1848, Ext. 123 Mondays & Thursdays: 7-9 AM.  First 15 patients are seen on a first come, first serve basis. °  ° °Medicaid-accepting [place] Providers: ° °Organization         Address  Phone   Notes  °[facility] [address], [place] (336) 641-2100 Also accepts self-pay patients.  °[facility] [address], [name] ° (336) 856-9996   °[facility] [address], [place]  (336) 288-8857   °[facility] [address] [place] [address], [place] (336) 299-7000   °Veita Bland [address], [place]  ° (336) [phone] Only accepts [place] Access Medicaid patients after they have their name applied to their card.  ° °Self-Pay (no insurance) in [place]: ° °Organization         Address  Phone   Notes  °Sickle Cell Patients, [facility] [address], [place] (336) 832-1970   °[facility] [address], [place] (336) 832-4400   °[facility] [place] [facility] ° [address], [place] (336) 992-4800   °[facility]/Dr. Osei-Bonsu ° [address] [place] [address], [place] or [address], [place] (336) [phone] Phone number for both [place] and [place] locations is the same.  °[facility] [address], [place] (336) 299-0000   °[facility] [address] [place] [address], [place] or [address] (336) 852-7530 °(336) 878-2260   °[facility] [address], [place] (336) 350-1642, phone; (336) 294-5005, fax Sees patients 1st and 3rd Saturday of every month.  Must not qualify for public or private insurance (i.e. Medicaid, Medicare, [place] Health Choice, Veterans' Benefits) • Household income should be no more than 200% of the poverty level •The clinic cannot treat you if you are pregnant or think you are pregnant • Sexually transmitted diseases are not treated at the clinic.  ° ° °Dental Care: °Organization         Address  Phone  Notes  °[place] [facility] [address], [place] (336) 641-6152 Accepts children up to age 21 who are enrolled in Medicaid or [place] Health Choice; pregnant women with a Medicaid card; and children who have applied for Medicaid or [place] Health Choice, but were declined, whose parents can pay a reduced fee at time of service.  °[place]  [facility] [place]  [address], [place] (336) 641-7733 Accepts children up to age 21 who are enrolled in Medicaid or [place] Health Choice; pregnant women with a Medicaid card; and children who have applied for Medicaid or [place] Health Choice, but were declined, whose parents can pay a reduced fee at time of service.  °[facility] ° [address], [place] (336) 641-4533 Patients are seen by appointment only. Walk-ins are not accepted. [facility] will see patients 18 years of age and older. °Monday - Tuesday (8am-5pm) °Most Wednesdays (8:30-5pm) °$30 per visit, cash only  °[facility] ° [address], [place] (336) 641-4533 Patients are seen by appointment only. Walk-ins are not accepted. [facility] will see patients 18 years of age and older. °One   Wednesday Evening (Monthly: Volunteer Based).  $30 per visit, cash only  °[facility]  (919) [phone] for adults; Children under age 4, call Graduate Pediatric Dentistry at ([phone]) [phone]. Children aged 4-14, please call ([phone]) [phone] to request a pediatric application. ° Dental services are provided in all areas of dental care including fillings, crowns and bridges, complete and partial dentures, implants, gum treatment, root canals, and extractions. Preventive care is also provided. Treatment is provided to both adults and children. °Patients are selected via a lottery and there is often a waiting list. °  °[facility] [address], °[place] ° (336) 763-8833 [email or web address].[email or web address] °  °[facility] [address], [place], [place] (336)723-1848, Ext. 123 Second and Fourth Thursday of each month, opens at 6:30 AM; Clinic ends at 9 AM.  Patients are seen on a first-come first-served basis, and a limited number are seen during each clinic.  ° °[facility] ° [address], [place], [place] (336) [phone]    Eligibility Requirements °You must have lived in [place], [place], or [place] counties for at least the last three months. °  You cannot be eligible for state or federal sponsored healthcare insurance, including Veterans Administration, Medicaid, or Medicare. °  You generally cannot be eligible for healthcare insurance through your employer.  °  How to apply: °Eligibility screenings are held every Tuesday and Wednesday afternoon from 1:00 pm until 4:00 pm. You do not need an appointment for the interview!  °[facility] [address], [place], [place] 336-631-2330   °[facility]  336-342-8273   °[facility]  336-703-3100   °[facility]  336-570-6415   ° °Behavioral Health Resources in the Community: °Intensive Outpatient Programs °Organization         Address  Phone  Notes  °[facility] [address], [place], [place] 336-878-6098   °[facility] [address], [place], [place] 336-832-9800   °[facility] [address], [place], [place] ° 336-882-2125   °[facility] [address],  °[place], [place] 1-800-853-5163 or 336-641-4981   °Substance Abuse Resources °Organization         Address  Phone  Notes  °[facility]  336-882-2125   °[facility]  336-784-9470   °[facility]  336-285-9073   °[facility]  336-845-3988   °Residential & Outpatient Substance Abuse Program  1-800-659-3381   °Psychological Services °Organization         Address  Phone  Notes  °[facility]  336- 832-9600   °[facility]  336- 378-7881   °[facility] [address], [place] 1-800-853-5163 or 336-641-4981   ° °Mobile Crisis Teams °Organization         Address  Phone  Notes  °[facility], Mobile Crisis Care Unit  1-877-626-1772   °Assertive °[facility] ° [address]  [place], [place] 336-834-9664   °Sharon DeEsch [address] °[place] 336-554-5454   ° °Self-Help/Support Groups °Organization         Address  Phone             Notes  °[facility] - variety of support groups  336- 373-1402 Call for more information  °Narcotics Anonymous (NA), [facility] [address], °[place]  2 meetings at this location  ° °  Residential Treatment Programs Organization         Address  Phone  Notes  [facility] [address],    [place]  1-603-121-6049   [facility]  [address] [place] [id number], [place], [place]   [place], [place] [phone] Admissions: 8am-3pm M-F  [facility] [place] [address],    [place], [place] [phone]   [facility] [address] [place], [place], [place]   [facility] [address],  [place], [place]   [facility] Intensive Outpatient [address], [place], [place]   [facility] ([place]  [place], [place] [phone] or [phone]   [facility] [address], [place], [place] Accepts Medicaid  [facility] [address]  [place] 1-951-082-8876 Substance Abuse/Addiction Treatment   [facility]         Address  Phone  Notes  [facility]  [phone]   Domenic Schwab, PhD [address] [place], [place]   214-687-6140 or 929-329-5978   [place], [place] (250)120-6266   [facility] [address], [place], [place] [phone] Insurance/Medicaid/sponsorship through [facility] Families [address]                                    [place], [place] [phone] [place] [address] [place], [place] [phone]) [phone]    Dr. Adele Schilder  [phone]   [facility] Dept. 1) [address] [place] 2) [place] 3)  [address], [place] [phone]  [phone]   [place] [phone] or [phone] (After Hours)      Take over the counter tylenol and ibuprofen, as directed on packaging, as needed for discomfort or fever. Take the prescription as directed.  Increase your fluid intake (ie:  Gatoraide) for the next few days, as discussed. Call your regular medical doctor tomorrow to schedule a follow up appointment within the next 2 days.  Return to the Emergency Department immediately sooner if worsening.

## 2015-04-12 NOTE — ED Provider Notes (Signed)
CSN: [id number]     Arrival date & time 04/12/15  1742 History   First MD Initiated Contact with Patient 04/12/15 1843     Chief Complaint  Patient presents with  . Cough     HPI  Pt was seen at 1850. Per pt and his wife, c/o gradual onset and persistence of constant "cough" for the past 2 days. Has been associated with generalized body aches/fatigue and home fevers to "103." Pt took motrin today at 1600 with slow improvement of his fever. Pt states he has been visiting his mother in another hospital for the past week and "staying there hours at a time." Endorses poor PO intake over the past week. Denies N/V/D, no CP/SOB, no abd pain, no back pain, no rash, no neck pain, no sore throat.    Past Medical History  Diagnosis Date  . Kidney stone    History reviewed. No pertinent past surgical history.  Social History  Substance Use Topics  . Smoking status: Former Smoker -- 0.75 packs/day    Types: Cigarettes  . Smokeless tobacco: None  . Alcohol Use: No    Review of Systems ROS: Statement: All systems negative except as marked or noted in the HPI; Constitutional: +fever and chills, generalized weakness/fatigue. ; ; Eyes: Negative for eye pain, redness and discharge. ; ; ENMT: Negative for ear pain, hoarseness, nasal congestion, sinus pressure and sore throat. ; ; Cardiovascular: Negative for chest pain, palpitations, diaphoresis, dyspnea and peripheral edema. ; ; Respiratory: +cough. Negative for wheezing and stridor. ; ; Gastrointestinal: Negative for nausea, vomiting, diarrhea, abdominal pain, blood in stool, hematemesis, jaundice and rectal bleeding. . ; ; Genitourinary: Negative for dysuria, flank pain and hematuria. ; ; Musculoskeletal: Negative for back pain and neck pain. Negative for swelling and trauma.; ; Skin: Negative for pruritus, rash, abrasions, blisters, bruising and skin lesion.; ; Neuro: Negative for headache, lightheadedness and neck stiffness. Negative for weakness,  altered level of consciousness , altered mental status, extremity weakness, paresthesias, involuntary movement, seizure and syncope.      Allergies  Review of patient's allergies indicates no known allergies.  Home Medications   Prior to Admission medications   Medication Sig Start Date End Date Taking? Authorizing Provider  cephALEXin (KEFLEX) 500 MG capsule Take 1 capsule (500 mg total) by mouth 3 (three) times daily. 12/24/13   Waldemar Dickens, MD  cyclobenzaprine (FLEXERIL) 10 MG tablet Take 10 mg by mouth 3 (three) times daily as needed for muscle spasms.    Historical Provider, MD  ibuprofen (ADVIL,MOTRIN) 200 MG tablet Take 400-800 mg by mouth every 4 (four) hours as needed. For fever and pain    Historical Provider, MD  ketorolac (TORADOL) 10 MG tablet Take 1 tablet (10 mg total) by mouth every 6 (six) hours as needed for moderate pain. 03/17/14   Nat Christen, MD  ondansetron (ZOFRAN-ODT) 4 MG disintegrating tablet Take 4 mg by mouth every 6 (six) hours as needed for nausea or vomiting.    Historical Provider, MD  oxyCODONE-acetaminophen (PERCOCET) 5-325 MG per tablet Take 2 tablets by mouth every 4 (four) hours as needed. 03/17/14   Nat Christen, MD  tamsulosin (FLOMAX) 0.4 MG CAPS capsule Take 0.4 mg by mouth.    Historical Provider, MD   BP 107/84 mmHg  Pulse 83  Temp(Src) 99.6 F (37.6 C) (Oral)  Resp 16  Ht 5\' 9"  (1.753 m)  Wt 160 lb (72.576 kg)  BMI 23.62 kg/m2  SpO2 96%  19:14 Orthostatic Vital Signs LB  Orthostatic Lying  - BP- Lying: 97/60 mmHg ; Pulse- Lying: 79  Orthostatic Sitting - BP- Sitting: 103/68 mmHg ; Pulse- Sitting: 90  Orthostatic Standing at 0 minutes - BP- Standing at 0 minutes: 107/84 mmHg ; Pulse- Standing at 0 minutes: 97     Patient Vitals for the past 24 hrs:  BP Temp Temp src Pulse Resp SpO2 Height Weight  04/12/15 2200 129/83 mmHg 98.7 F (37.1 C) Oral 92 18 100 % - -  04/12/15 2145 - - - 89 20 98 % - -  04/12/15 2130 112/64 mmHg - - 87 19 97 %  - -  04/12/15 2115 - - - 85 20 96 % - -  04/12/15 2100 112/67 mmHg - - 94 15 96 % - -  04/12/15 2045 - - - 87 16 96 % - -  04/12/15 2030 100/68 mmHg - - 83 14 95 % - -  04/12/15 2015 - - - 82 (!) 8 96 % - -  04/12/15 2000 108/68 mmHg - - 89 26 96 % - -  04/12/15 1945 - - - 81 15 95 % - -  04/12/15 1930 - - - 85 21 96 % - -  04/12/15 1925 107/84 mmHg 99.6 F (37.6 C) Oral 83 16 96 % - -  04/12/15 1900 103/63 mmHg - - 93 - 97 % - -  04/12/15 1845 - - - 104 - 96 % - -  04/12/15 1747 (!) 87/52 mmHg 100.5 F (38.1 C) Oral 78 18 98 % 5\' 9"  (1.753 m) 160 lb (72.576 kg)      Physical Exam  1855: Physical examination:  Nursing notes reviewed; Vital signs and O2 SAT reviewed;  Constitutional: Well developed, Well nourished, In no acute distress; Head:  Normocephalic, atraumatic; Eyes: EOMI, PERRL, No scleral icterus; ENMT: TM's clear bilat. +edemetous nasal turbinates bilat with clear rhinorrhea. Mouth and pharynx normal, Mucous membranes dry. Mouth and pharynx without lesions. No tonsillar exudates. No intra-oral edema. No submandibular or sublingual edema. No hoarse voice, no drooling, no stridor. No pain with manipulation of larynx. No trismus; Neck: Supple, Full range of motion, No lymphadenopathy. No meningeal signs.; Cardiovascular: Regular rate and rhythm, No gallop; Respiratory: Breath sounds clear & equal bilaterally, No wheezes.  Speaking full sentences with ease, Normal respiratory effort/excursion; Chest: Nontender, Movement normal; Abdomen: Soft, Nontender, Nondistended, Normal bowel sounds; Genitourinary: No CVA tenderness; Extremities: Pulses normal, No tenderness, No edema, No calf edema or asymmetry.; Neuro: AA&Ox3, Major CN grossly intact.  Speech clear. No gross focal motor or sensory deficits in extremities.; Skin: Color normal, Warm, Dry.   ED Course  Procedures (including critical care time) Labs Review  Imaging Review  I have personally reviewed and evaluated these images  and lab results as part of my medical decision-making.   EKG Interpretation   Date/Time:  Tuesday April 12 2015 19:20:36 EST Ventricular Rate:  81 PR Interval:  144 QRS Duration: 105 QT Interval:  385 QTC Calculation: 447 R Axis:   72 Text Interpretation:  Sinus rhythm Abnormal R-wave progression, early  transition No old tracing to compare Confirmed by [facility]  MD, Nunzio Cory  [phone]) on 04/12/2015 7:55:12 PM      MDM  MDM Reviewed: previous chart, nursing note and vitals Reviewed previous: labs and ECG Interpretation: labs, ECG and x-ray     Results for orders placed or performed during the hospital encounter of 04/12/15  Influenza panel by PCR (type  A & B, H1N1)  Result Value Ref Range   Influenza A By PCR NEGATIVE NEGATIVE   Influenza B By PCR POSITIVE (A) NEGATIVE   H1N1 flu by pcr NOT DETECTED NOT DETECTED  Urinalysis, Routine w reflex microscopic  Result Value Ref Range   Color, Urine YELLOW YELLOW   APPearance CLEAR CLEAR   Specific Gravity, Urine 1.025 1.005 - 1.030   pH 6.0 5.0 - 8.0   Glucose, UA NEGATIVE NEGATIVE mg/dL   Hgb urine dipstick NEGATIVE NEGATIVE   Bilirubin Urine NEGATIVE NEGATIVE   Ketones, ur NEGATIVE NEGATIVE mg/dL   Protein, ur NEGATIVE NEGATIVE mg/dL   Nitrite NEGATIVE NEGATIVE   Leukocytes, UA NEGATIVE NEGATIVE  Basic metabolic panel  Result Value Ref Range   Sodium 137 135 - 145 mmol/L   Potassium 3.7 3.5 - 5.1 mmol/L   Chloride 107 101 - 111 mmol/L   CO2 19 (L) 22 - 32 mmol/L   Glucose, Bld 116 (H) 65 - 99 mg/dL   BUN 13 6 - 20 mg/dL   Creatinine, Ser 1.35 (H) 0.61 - 1.24 mg/dL   Calcium 8.7 (L) 8.9 - 10.3 mg/dL   GFR calc non Af Amer 56 (L) >60 mL/min   GFR calc Af Amer >60 >60 mL/min   Anion gap 11 5 - 15  Troponin I  Result Value Ref Range   Troponin I <0.03 <0.031 ng/mL  Lactic acid, plasma  Result Value Ref Range   Lactic Acid, Venous 0.8 0.5 - 2.0 mmol/L  CBC with Differential  Result Value Ref Range   WBC 11.0  (H) 4.0 - 10.5 K/uL   RBC 5.04 4.22 - 5.81 MIL/uL   Hemoglobin 14.4 13.0 - 17.0 g/dL   HCT 42.6 39.0 - 52.0 %   MCV 84.5 78.0 - 100.0 fL   MCH 28.6 26.0 - 34.0 pg   MCHC 33.8 30.0 - 36.0 g/dL   RDW 13.3 11.5 - 15.5 %   Platelets 237 150 - 400 K/uL   Neutrophils Relative % 82 %   Neutro Abs 9.1 (H) 1.7 - 7.7 K/uL   Lymphocytes Relative 8 %   Lymphs Abs 0.8 0.7 - 4.0 K/uL   Monocytes Relative 9 %   Monocytes Absolute 1.0 0.1 - 1.0 K/uL   Eosinophils Relative 1 %   Eosinophils Absolute 0.1 0.0 - 0.7 K/uL   Basophils Relative 0 %   Basophils Absolute 0.0 0.0 - 0.1 K/uL   Dg Chest 2 View 04/12/2015  CLINICAL DATA:  Cough and fever.  Congestion for 2 days EXAM: CHEST  2 VIEW COMPARISON:  07/14/2011 FINDINGS: The heart size and mediastinal contours are within normal limits. Both lungs are clear. The visualized skeletal structures are unremarkable. IMPRESSION: No active cardiopulmonary disease. Electronically Signed   By: Kerby Moors M.D.   On: 04/12/2015 18:10   Results for BLAIR, BRULL (MRN [id number]) as of 04/12/2015 21:44  Ref. Range 10/18/2007 22:25 07/14/2011 10:25 04/12/2015 18:45  BUN Latest Ref Range: 6-20 mg/dL 20 14 13   Creatinine Latest Ref Range: 0.61-1.24 mg/dL 1.63 (H) 1.13 1.35 (H)    2205:   Pt not orthostatic on VS, but BP lower than pt's baseline per EPIC chart review. IVF boluses given with good response. Pt now states he feels better, "is hungry" and wants to go home. Pt has tol PO well while in the ED without N/V.  No stooling while in the ED.  Pt ambulated with steady gait, easy resps, Sats 98% R/A,  NAD. Abd remains benign, resps easy, VSS. Offered tamiflu; pt and spouse decline. Dx and testing d/w pt and family.  Questions answered.  Verb understanding, agreeable to d/c home with outpt f/u.     Francine Graven, DO 04/15/15 0000

## 2015-04-12 NOTE — ED Notes (Signed)
Patient reports of productive cough and fever x2 days. Patient has recently been visiting family for the past week in another hospital. Took 800mg  of ibuprofen at 1600.

## 2015-04-12 NOTE — ED Notes (Signed)
Pt walked around the nurses station with no dizziness and no short of breath. Oxygen level was 98% when ambulated in the hallway.

## 2015-04-14 LAB — URINE CULTURE: Culture: 1000

## 2016-01-12 DIAGNOSIS — J069 Acute upper respiratory infection, unspecified: Secondary | ICD-10-CM | POA: Diagnosis not present

## 2016-10-05 DIAGNOSIS — R21 Rash and other nonspecific skin eruption: Secondary | ICD-10-CM | POA: Diagnosis not present

## 2017-01-10 DIAGNOSIS — R0789 Other chest pain: Secondary | ICD-10-CM | POA: Diagnosis not present

## 2017-01-10 DIAGNOSIS — M25512 Pain in left shoulder: Secondary | ICD-10-CM | POA: Diagnosis not present

## 2017-02-22 DIAGNOSIS — Z1159 Encounter for screening for other viral diseases: Secondary | ICD-10-CM | POA: Diagnosis not present

## 2017-02-22 DIAGNOSIS — E7800 Pure hypercholesterolemia, unspecified: Secondary | ICD-10-CM | POA: Diagnosis not present

## 2017-02-22 DIAGNOSIS — Z0000 Encounter for general adult medical examination without abnormal findings: Secondary | ICD-10-CM | POA: Diagnosis not present

## 2017-02-22 DIAGNOSIS — Z125 Encounter for screening for malignant neoplasm of prostate: Secondary | ICD-10-CM | POA: Diagnosis not present

## 2017-02-27 ENCOUNTER — Other Ambulatory Visit (HOSPITAL_COMMUNITY): Payer: Self-pay | Admitting: Family Medicine

## 2017-02-27 DIAGNOSIS — R918 Other nonspecific abnormal finding of lung field: Secondary | ICD-10-CM

## 2017-03-01 ENCOUNTER — Ambulatory Visit (HOSPITAL_COMMUNITY): Payer: BLUE CROSS/BLUE SHIELD

## 2017-03-15 DIAGNOSIS — C4442 Squamous cell carcinoma of skin of scalp and neck: Secondary | ICD-10-CM | POA: Diagnosis not present

## 2017-03-22 ENCOUNTER — Ambulatory Visit (HOSPITAL_COMMUNITY)
Admission: EM | Admit: 2017-03-22 | Discharge: 2017-03-22 | Disposition: A | Discharge status: Home / Self Care | Payer: BLUE CROSS/BLUE SHIELD | Attending: Internal Medicine | Admitting: Internal Medicine

## 2017-03-22 ENCOUNTER — Encounter (HOSPITAL_COMMUNITY): Payer: Self-pay | Admitting: Family Medicine

## 2017-03-22 DIAGNOSIS — J0110 Acute frontal sinusitis, unspecified: Secondary | ICD-10-CM | POA: Diagnosis not present

## 2017-03-22 MED ORDER — PREDNISONE 10 MG PO TABS: 40.0000 mg | ORAL_TABLET | Freq: Every day | ORAL | 0 refills | Status: AC

## 2017-03-22 MED ORDER — AMOXICILLIN-POT CLAVULANATE 875-125 MG PO TABS: 1.0000 | ORAL_TABLET | Freq: Two times a day (BID) | ORAL | 0 refills | Status: DC

## 2017-03-22 NOTE — ED Provider Notes (Signed)
[place]    CSN: [id number] Arrival date & time: 03/22/17  1130     History   Chief Complaint Chief Complaint  Patient presents with  . Facial Pain    HPI Nathan Cox is a 62 y.o. male.   62 year old male, presenting today complaining of sinus pain and pressure.  Symptoms started about a week ago and have been gradually worsening.  Patient has had nasal congestion as well as frontal headache.  Denies any fever, chills, sore throat, chest pain or shortness of breath.   The history is provided by the patient.  URI  Presenting symptoms: congestion, cough and facial pain   Presenting symptoms: no ear pain, no fever and no sore throat   Severity:  Moderate Onset quality:  Gradual Duration:  1 week Timing:  Constant Progression:  Worsening Chronicity:  New Relieved by:  Nothing Worsened by:  Nothing Ineffective treatments:  OTC medications Associated symptoms: headaches and sinus pain   Associated symptoms: no arthralgias, no myalgias and no neck pain   Risk factors: not elderly, no chronic cardiac disease, no chronic kidney disease, no chronic respiratory disease, no diabetes mellitus and no sick contacts     Past Medical History:  Diagnosis Date  . Kidney stone     There are no active problems to display for this patient.   History reviewed. No pertinent surgical history.  OB History    No data available       Home Medications    Prior to Admission medications   Medication Sig Start Date End Date Taking? Authorizing Provider  amoxicillin-clavulanate (AUGMENTIN) 875-125 MG tablet Take 1 tablet by mouth every 12 (twelve) hours. 03/22/17   Blue, Olivia C, PA-C  benzonatate (TESSALON) 100 MG capsule Take 1 capsule (100 mg total) by mouth 3 (three) times daily as needed for cough. 04/12/15   Francine Graven, DO  guaiFENesin (MUCINEX) 600 MG 12 hr tablet Take 600 mg by mouth once.    [provider]  ibuprofen (ADVIL,MOTRIN) 200 MG  tablet Take 400-800 mg by mouth every 4 (four) hours as needed. For fever and pain    [provider]  predniSONE (DELTASONE) 10 MG tablet Take 4 tablets (40 mg total) by mouth daily for 5 days. 03/22/17 03/27/17  Phebe Colla, PA-C    Family History History reviewed. No pertinent family history.  Social History Social History   Tobacco Use  . Smoking status: Former Smoker    Packs/day: 0.75    Types: Cigarettes  Substance Use Topics  . Alcohol use: No  . Drug use: No     Allergies   Patient has no known allergies.   Review of Systems Review of Systems  Constitutional: Negative for chills and fever.  HENT: Positive for congestion and sinus pain. Negative for ear pain and sore throat.   Eyes: Negative for pain and visual disturbance.  Respiratory: Positive for cough. Negative for shortness of breath.   Cardiovascular: Negative for chest pain and palpitations.  Gastrointestinal: Negative for abdominal pain and vomiting.  Genitourinary: Negative for dysuria and hematuria.  Musculoskeletal: Negative for arthralgias, back pain, myalgias and neck pain.  Skin: Negative for color change and rash.  Neurological: Positive for headaches. Negative for seizures and syncope.  All other systems reviewed and are negative.    Physical Exam Triage Vital Signs ED Triage Vitals [03/22/17 1217]  Enc Vitals Group     BP (!) 146/79     Pulse Rate  75     Resp 18     Temp 98.1 F (36.7 C)     Temp src      SpO2 100 %     Weight      Height      Head Circumference      Peak Flow      Pain Score      Pain Loc      Pain Edu?      Excl. in [place]?    No data found.  Updated Vital Signs BP (!) 146/79   Pulse 75   Temp 98.1 F (36.7 C)   Resp 18   SpO2 100%   Visual Acuity Right Eye Distance:   Left Eye Distance:   Bilateral Distance:    Right Eye Near:   Left Eye Near:    Bilateral Near:     Physical Exam  Constitutional: He appears well-developed and  well-nourished.  HENT:  Head: Normocephalic and atraumatic.  Right Ear: Hearing, tympanic membrane, external ear and ear canal normal.  Left Ear: Hearing, tympanic membrane, external ear and ear canal normal.  Nose: Right sinus exhibits frontal sinus tenderness. Left sinus exhibits frontal sinus tenderness.  Mouth/Throat: Uvula is midline and oropharynx is clear and moist. No oropharyngeal exudate, posterior oropharyngeal edema, posterior oropharyngeal erythema or tonsillar abscesses.  Eyes: Conjunctivae are normal.  Neck: Neck supple.  Cardiovascular: Normal rate and regular rhythm.  No murmur heard. Pulmonary/Chest: Effort normal and breath sounds normal. No stridor. No respiratory distress. He has no decreased breath sounds. He has no wheezes. He has no rhonchi. He has no rales.  Abdominal: Soft. There is no tenderness.  Musculoskeletal: He exhibits no edema.  Neurological: He is alert.  Skin: Skin is warm and dry.  Psychiatric: He has a normal mood and affect.  Nursing note and vitals reviewed.    UC Treatments / Results  Labs (all labs ordered are listed, but only abnormal results are displayed) Labs Reviewed - No data to display  EKG  EKG Interpretation None       Radiology No results found.  Procedures Procedures (including critical care time)  Medications Ordered in UC Medications - No data to display   Initial Impression / Assessment and Plan / UC Course  I have reviewed the triage vital signs and the nursing notes.  Pertinent labs & imaging results that were available during my care of the patient were reviewed by me and considered in my medical decision making (see chart for details).     We will treat for acute sinusitis with prednisone and Augmentin  Final Clinical Impressions(s) / UC Diagnoses   Final diagnoses:  Acute non-recurrent frontal sinusitis    ED Discharge Orders        Ordered    amoxicillin-clavulanate (AUGMENTIN) 875-125 MG tablet   Every 12 hours     03/22/17 1253    predniSONE (DELTASONE) 10 MG tablet  Daily     03/22/17 1253       Controlled Substance Prescriptions [place] Controlled Substance Registry consulted? Not Applicable   Phebe Colla, [place] 03/22/17 1254

## 2017-03-22 NOTE — ED Triage Notes (Signed)
Pt here for sinus congestion x 1 week and using OTC meds without relief.

## 2017-03-28 DIAGNOSIS — R972 Elevated prostate specific antigen [PSA]: Secondary | ICD-10-CM | POA: Diagnosis not present

## 2017-04-18 DIAGNOSIS — C4442 Squamous cell carcinoma of skin of scalp and neck: Secondary | ICD-10-CM | POA: Diagnosis not present

## 2017-06-28 DIAGNOSIS — R972 Elevated prostate specific antigen [PSA]: Secondary | ICD-10-CM | POA: Diagnosis not present

## 2017-08-02 DIAGNOSIS — D1801 Hemangioma of skin and subcutaneous tissue: Secondary | ICD-10-CM | POA: Diagnosis not present

## 2017-08-02 DIAGNOSIS — D229 Melanocytic nevi, unspecified: Secondary | ICD-10-CM | POA: Diagnosis not present

## 2017-08-02 DIAGNOSIS — L814 Other melanin hyperpigmentation: Secondary | ICD-10-CM | POA: Diagnosis not present

## 2017-08-02 DIAGNOSIS — L570 Actinic keratosis: Secondary | ICD-10-CM | POA: Diagnosis not present

## 2017-08-02 DIAGNOSIS — L821 Other seborrheic keratosis: Secondary | ICD-10-CM | POA: Diagnosis not present

## 2017-09-27 DIAGNOSIS — R972 Elevated prostate specific antigen [PSA]: Secondary | ICD-10-CM | POA: Diagnosis not present

## 2018-01-31 DIAGNOSIS — R972 Elevated prostate specific antigen [PSA]: Secondary | ICD-10-CM | POA: Diagnosis not present

## 2018-02-28 DIAGNOSIS — Z23 Encounter for immunization: Secondary | ICD-10-CM | POA: Diagnosis not present

## 2018-02-28 DIAGNOSIS — E7800 Pure hypercholesterolemia, unspecified: Secondary | ICD-10-CM | POA: Diagnosis not present

## 2018-02-28 DIAGNOSIS — Z0000 Encounter for general adult medical examination without abnormal findings: Secondary | ICD-10-CM | POA: Diagnosis not present

## 2018-05-02 DIAGNOSIS — R972 Elevated prostate specific antigen [PSA]: Secondary | ICD-10-CM | POA: Diagnosis not present

## 2018-06-19 DIAGNOSIS — L570 Actinic keratosis: Secondary | ICD-10-CM | POA: Diagnosis not present

## 2018-08-14 DIAGNOSIS — N400 Enlarged prostate without lower urinary tract symptoms: Secondary | ICD-10-CM | POA: Diagnosis not present

## 2018-08-14 DIAGNOSIS — R972 Elevated prostate specific antigen [PSA]: Secondary | ICD-10-CM | POA: Diagnosis not present

## 2018-08-14 DIAGNOSIS — R3912 Poor urinary stream: Secondary | ICD-10-CM | POA: Diagnosis not present

## 2018-11-14 DIAGNOSIS — N400 Enlarged prostate without lower urinary tract symptoms: Secondary | ICD-10-CM | POA: Diagnosis not present

## 2018-12-11 DIAGNOSIS — R972 Elevated prostate specific antigen [PSA]: Secondary | ICD-10-CM | POA: Diagnosis not present

## 2018-12-16 DIAGNOSIS — R8279 Other abnormal findings on microbiological examination of urine: Secondary | ICD-10-CM | POA: Diagnosis not present

## 2018-12-16 DIAGNOSIS — C61 Malignant neoplasm of prostate: Secondary | ICD-10-CM | POA: Diagnosis not present

## 2018-12-16 DIAGNOSIS — R3912 Poor urinary stream: Secondary | ICD-10-CM | POA: Diagnosis not present

## 2018-12-18 ENCOUNTER — Encounter: Payer: Self-pay | Admitting: Radiation Oncology

## 2018-12-18 NOTE — Progress Notes (Signed)
GU Location of Tumor / Histology: prostatic adenocarcinoma  If Prostate Cancer, Gleason Score is (4 + 3) and PSA is (5.98). Prostate volume: 29.52 cc.     Biopsies of prostate (if applicable) revealed:   Past/Anticipated interventions by urology, if any: prostate biopsy, per notes felt a bone scan nor ct scan were indicated, referred to Dr. Tammi Klippel for consideration of radiotherapy  Past/Anticipated interventions by medical oncology, if any: no  Weight changes, if any: no  Bowel/Bladder complaints, if any: IPSS 3. SHIM 25 without medication. Reports dysuria resolved with antibiotic. Patient reports having four days of antibiotics left to take. Denies hematuria. Reports weak urine stream. Reports nocturia x 1. Taking Flomax 0.4 mg. Denies urinary leakage or incontinence. Denies any bowel complaints.   Nausea/Vomiting, if any: no  Pain issues, if any:  denies  SAFETY ISSUES:  Prior radiation? denies  Pacemaker/ICD? denies  Possible current pregnancy? no, male patient  Is the patient on methotrexate? denies  Current Complaints / other details:  63 year old male. Girlfriend, Ericka Pontiff is a Marine scientist. 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 Former smoker stopped in 2015.One maternal aunt with hx of lung ca. Another maternal aunt with breast ca.

## 2018-12-19 ENCOUNTER — Telehealth: Payer: Self-pay | Admitting: *Deleted

## 2018-12-19 ENCOUNTER — Encounter: Payer: Self-pay | Admitting: Radiation Oncology

## 2018-12-19 ENCOUNTER — Other Ambulatory Visit: Payer: Self-pay

## 2018-12-19 ENCOUNTER — Ambulatory Visit
Admission: RE | Admit: 2018-12-19 | Discharge: 2018-12-19 | Disposition: A | Discharge status: Home / Self Care | Payer: BC Managed Care – PPO | Source: Ambulatory Visit | Attending: Radiation Oncology | Admitting: Radiation Oncology

## 2018-12-19 VITALS — Ht 68.0 in | Wt 160.0 lb

## 2018-12-19 DIAGNOSIS — C61 Malignant neoplasm of prostate: Secondary | ICD-10-CM

## 2018-12-19 DIAGNOSIS — R972 Elevated prostate specific antigen [PSA]: Secondary | ICD-10-CM | POA: Diagnosis not present

## 2018-12-19 DIAGNOSIS — Z808 Family history of malignant neoplasm of other organs or systems: Secondary | ICD-10-CM | POA: Diagnosis not present

## 2018-12-19 HISTORY — DX: Malignant neoplasm of prostate: C61

## 2018-12-19 NOTE — Progress Notes (Signed)
Radiation Oncology         [phone] ________________________________  Initial outpatient Consultation - Conducted via Telephone due to current COVID-19 concerns for limiting patient exposure  Name: Nathan Cox MRN: [id number]  Date: 12/19/2018  DOB: 1955/02/20  ER:[id number], L.Marlou Sa, MD  Kathie Rhodes, MD   REFERRING PHYSICIAN: Kathie Rhodes, MD  DIAGNOSIS: 64 y.o. gentleman with Stage T1c adenocarcinoma of the prostate with Gleason score of 4+3, and PSA of 5.98.    ICD-10-CM   1. Malignant neoplasm of prostate ([place])  C61     HISTORY OF PRESENT ILLNESS: Nathan Cox is a 63 y.o. adult with a diagnosis of prostate cancer. He has a longstanding history of elevated PSA and was previously evaluated with Dr. Diona Fanti in 04/2006 when his PSA was 4.5. A prostate biopsy was recommended at that time, but the patient failed to return for follow up.  He has continued to monitor the PSA with his PCP and has some fluctuation in the values.  His PSA was noted to be elevated at 6.49 in 02/2017 and he was therefore referred back to [facility] for further evaluation.  He met with Dr. Karsten Ro in consultation on 03/20/2017 and a digital rectal examination was performed at that time revealing no nodules or concerning findings.  His PSA was repeated at that time and had decreased to 4.76 so he opted to proceed with active PSA surveillance. On 08/15/2018, his PSA had increased to 5.36 so they again discussed the recommendation for biopsy but the patient declined.  A repeat PSA on 11/16/2018 remained elevated at 5.98 so he proceeded to transrectal ultrasound with 12 biopsies of the prostate on 12/11/2018.  The prostate volume measured 29.52 cc.  Out of 12 core biopsies, 3 were positive.  The maximum Gleason score was 4+3, and this was seen in left base lateral, with perineural invasion identified. Additionally, Gleason 3+4 was seen in left base, and Gleason 3+3 in left mid lateral (small focus).  The  patient reviewed the biopsy results with his urologist and he has kindly been referred today for discussion of potential radiation treatment options.  PREVIOUS RADIATION THERAPY: No  PAST MEDICAL HISTORY:  Past Medical History:  Diagnosis Date  . Kidney stone   . Prostate cancer ([place])       PAST SURGICAL HISTORY: Past Surgical History:  Procedure Laterality Date  . PROSTATE BIOPSY      FAMILY HISTORY:  Family History  Problem Relation Age of Onset  . Lung cancer Maternal Aunt        smoker  . Breast cancer Maternal Aunt   . Colon cancer Neg Hx   . Pancreatic cancer Neg Hx   . Prostate cancer Neg Hx     SOCIAL HISTORY:  Social History   Socioeconomic History  . Marital status: Divorced    Spouse name: Not on file  . Number of children: Not on file  . Years of education: Not on file  . Highest education level: Not on file  Occupational History    Comment: driver  Social Needs  . Financial resource strain: Not on file  . Food insecurity    Worry: Not on file    Inability: Not on file  . Transportation needs    Medical: Not on file    Non-medical: Not on file  Tobacco Use  . Smoking status: Former Smoker    Packs/day: 1.00    Years: 30.00    Pack years: 30.00  Types: Cigarettes    Quit date: 02/12/2013    Years since quitting: 5.8  . Smokeless tobacco: Never Used  Substance and Sexual Activity  . Alcohol use: No  . Drug use: No  . Sexual activity: Not Currently  Lifestyle  . Physical activity    Days per week: Not on file    Minutes per session: Not on file  . Stress: Not on file  Relationships  . Social Herbalist on phone: Not on file    Gets together: Not on file    Attends religious service: Not on file    Active member of club or organization: Not on file    Attends meetings of clubs or organizations: Not on file    Relationship status: Not on file  . Intimate partner violence    Fear of current or ex partner: Not on file     Emotionally abused: Not on file    Physically abused: Not on file    Forced sexual activity: Not on file  Other Topics Concern  . Not on file  Social History Narrative  . Not on file    ALLERGIES: Patient has no known allergies.  MEDICATIONS:  Current Outpatient Medications  Medication Sig Dispense Refill  . acetaminophen (TYLENOL) 325 MG tablet Take 650 mg by mouth every 6 (six) hours as needed.    . Sulfamethoxazole-Trimethoprim (SULFAMETHOXAZOLE-TMP DS PO) Take by mouth.    . tamsulosin (FLOMAX) 0.4 MG CAPS capsule Take by mouth.    Marland Kitchen ibuprofen (ADVIL,MOTRIN) 200 MG tablet Take 400-800 mg by mouth every 4 (four) hours as needed. For fever and pain     No current facility-administered medications for this encounter.     REVIEW OF SYSTEMS:  On review of systems, the patient reports that he is doing well overall. He denies any chest pain, shortness of breath, cough, fevers, chills, night sweats, unintended weight changes. He denies any bowel disturbances, and denies abdominal pain, nausea or vomiting. He denies any new musculoskeletal or joint aches or pains. His IPSS was 3, indicating mild urinary symptoms. He reports he developed BOO and a UTI following his biopsy and was placed on antibiotics and Flomax with significant relief of his weak stream, incomplete emptying and dysuria. His SHIM was 25, indicating he does not have erectile dysfunction. A complete review of systems is obtained and is otherwise negative.  PHYSICAL EXAM:  Wt Readings from Last 3 Encounters:  12/19/18 160 lb (72.6 kg)  04/12/15 160 lb (72.6 kg)  03/17/14 160 lb (72.6 kg)   Temp Readings from Last 3 Encounters:  03/22/17 98.1 F (36.7 C)  04/12/15 98.7 F (37.1 C) (Oral)  03/17/14 98 F (36.7 C) (Oral)   BP Readings from Last 3 Encounters:  03/22/17 (!) 146/79  04/12/15 129/83  03/17/14 121/81   Pulse Readings from Last 3 Encounters:  03/22/17 75  04/12/15 92  03/17/14 79   Pain Assessment Pain  Score: 0-No pain/10  Unable to assess due to telephone consult visit format.  KPS = 100  100 - Normal; no complaints; no evidence of disease. 90   - Able to carry on normal activity; minor signs or symptoms of disease. 80   - Normal activity with effort; some signs or symptoms of disease. 75   - Cares for self; unable to carry on normal activity or to do active work. 60   - Requires occasional assistance, but is able to care for most of his  personal needs. 50   - Requires considerable assistance and frequent medical care. 23   - Disabled; requires special care and assistance. 35   - Severely disabled; hospital admission is indicated although death not imminent. 23   - Very sick; hospital admission necessary; active supportive treatment necessary. 10   - Moribund; fatal processes progressing rapidly. 0     - Dead  Karnofsky DA, Abelmann Gonzalez, Craver LS and Burchenal [facility] [phone]) The use of the nitrogen mustards in the palliative treatment of carcinoma: with particular reference to bronchogenic carcinoma Cancer 1 634-56  LABORATORY DATA:  Lab Results  Component Value Date   WBC 11.0 (H) 04/12/2015   HGB 14.4 04/12/2015   HCT 42.6 04/12/2015   MCV 84.5 04/12/2015   PLT 237 04/12/2015   Lab Results  Component Value Date   NA 137 04/12/2015   K 3.7 04/12/2015   CL 107 04/12/2015   CO2 19 (L) 04/12/2015   No results found for: ALT, AST, GGT, ALKPHOS, BILITOT   RADIOGRAPHY: No results found.    IMPRESSION/PLAN: This visit was conducted via Telephone to spare the patient unnecessary potential exposure in the healthcare setting during the current COVID-19 pandemic.  1. 63 y.o. gentleman with Stage T1c adenocarcinoma of the prostate with Gleason Score of 4+3, and PSA of 5.98. We discussed the patient's workup and outlined the nature of prostate cancer in this setting. The patient's T stage, Gleason's score, and PSA put him into the unfavorable intermediate risk group. Accordingly, he is  eligible for a variety of potential treatment options including brachytherapy, 5.5 weeks of external radiation or prostatectomy. We discussed the available radiation techniques, and focused on the details and logistics and delivery. We discussed and outlined the risks, benefits, short and long-term effects associated with radiotherapy and compared and contrasted these with prostatectomy. We also discussed the role of SpaceOAR in reducing the rectal toxicity associated with radiotherapy. He is adamantly not interested in prostatectomy.  He was encouraged to ask questions that were answered to his stated satisfaction.  He appears to have a good understanding of his disease and our treatment recommendations which are of curative intent.  At the end of the conversation the patient is interested in moving forward with brachytherapy and use of SpaceOAR to reduce rectal toxicity from radiotherapy.  We will share our discussion with Dr. Karsten Ro and move forward with scheduling his CT [facility] planning appointment in the near future.  The patient will be contacted by Romie Jumper in our office who will be working closely with him to coordinate OR scheduling and pre and post procedure appointments.  We will contact the pharmaceutical rep to ensure that [place] is available at the time of procedure.  He will have a prostate MRI following his post-seed CT SIM to confirm appropriate distribution of the [place].  Given current concerns for patient exposure during the COVID-19 pandemic, this encounter was conducted via telephone. The patient was notified in advance and was offered a MyChart meeting to allow for face to face communication but unfortunately reported that he did not have the appropriate resources/technology to support such a visit and instead preferred to proceed with telephone consult. The patient has given verbal consent for this type of encounter. The time spent during this encounter was 45 minutes. The  attendants for this meeting include Tyler Pita MD, Ashlyn Bruning PA-C, Ford Cliff, patient Nathan Cox and his significant other, Nathan Cox. During the encounter, Tyler Pita MD, Ashlyn Bruning PA-C,  and scribe, Wilburn Mylar were located at [facility] Radiation Oncology Department.  Patient [name] and Nathan Cox were located at home.    Nicholos Johns, PA-C    Tyler Pita, MD  [facility] Direct Dial: [phone]  Fax: [phone] [email or web address]  Skype  LinkedIn  This document serves as a record of services personally performed by Tyler Pita, MD and Freeman Caldron, PA-C. It was created on their behalf by Wilburn Mylar, a trained medical scribe. The creation of this record is based on the scribe's personal observations and the provider's statements to them. This document has been checked and approved by the attending provider.

## 2018-12-19 NOTE — Progress Notes (Signed)
See progress note under physician encounter. 

## 2018-12-19 NOTE — Telephone Encounter (Signed)
CALLED PATIENT TO ASK QUESTIONS, LVM FOR A RETURN CALL 

## 2018-12-25 ENCOUNTER — Telehealth: Payer: Self-pay | Admitting: Radiation Oncology

## 2018-12-25 NOTE — Telephone Encounter (Signed)
Received voicemail message from patient requesting return call. Patient expressed anxiety surrounding the one month time frame before brachytherapy starts. Attempted to reassure patient. At the end of the conversation patient confirmed his anxiety was less. Patient verbalized appreciation for the call back.

## 2019-01-12 ENCOUNTER — Telehealth: Payer: Self-pay | Admitting: Medical Oncology

## 2019-01-12 NOTE — Telephone Encounter (Signed)
Spoke with Mr. Nathan Cox to introduce myself as the prostate nurse navigator and discuss my role. I was unable to meet him 11/6 when he consulted with Dr. Tammi Klippel. He has decided on brachytherapy. He has had some anxiety while waiting for the procedure to be scheduled. He does have pre-op appointment 12/10. We discussed procedure and questions answered. I gave him my contact information and asked him to call me with questions or concerns. He voiced understanding.

## 2019-01-13 DIAGNOSIS — I4510 Unspecified right bundle-branch block: Secondary | ICD-10-CM

## 2019-01-13 HISTORY — DX: Unspecified right bundle-branch block: I45.10

## 2019-01-21 ENCOUNTER — Telehealth: Payer: Self-pay | Admitting: *Deleted

## 2019-01-21 NOTE — Telephone Encounter (Signed)
Called patient to remind of pre-seed appts. for 01-22-19, spoke with patient and he is aware of these appts.

## 2019-01-22 ENCOUNTER — Ambulatory Visit
Admission: RE | Admit: 2019-01-22 | Discharge: 2019-01-22 | Disposition: A | Discharge status: Home / Self Care | Payer: BC Managed Care – PPO | Source: Ambulatory Visit | Attending: Urology | Admitting: Urology

## 2019-01-22 ENCOUNTER — Other Ambulatory Visit: Payer: Self-pay

## 2019-01-22 DIAGNOSIS — C61 Malignant neoplasm of prostate: Secondary | ICD-10-CM | POA: Insufficient documentation

## 2019-01-25 NOTE — Progress Notes (Signed)
  Radiation Oncology         [phone] ________________________________  Name: Nathan Cox MRN: [id number]  Date: 01/22/2019  DOB: May 19, 1955  SIMULATION AND TREATMENT PLANNING NOTE PUBIC ARCH STUDY  [id number], L.Marlou Sa, MD  Alroy Dust, L.Marlou Sa, MD  DIAGNOSIS: 63 y.o. gentleman with Stage T1c adenocarcinoma of the prostate with Gleason score of 4+3, and PSA of 5.98     ICD-10-CM   1. Malignant neoplasm of prostate ([place])  C61     COMPLEX SIMULATION:  The patient presented today for evaluation for possible prostate seed implant. He was brought to the radiation planning suite and placed supine on the CT couch. A 3-dimensional image study set was obtained in upload to the planning computer. There, on each axial slice, I contoured the prostate gland. Then, using three-dimensional radiation planning tools I reconstructed the prostate in view of the structures from the transperineal needle pathway to assess for possible pubic arch interference. In doing so, I did not appreciate any pubic arch interference. Also, the patient's prostate volume was estimated based on the drawn structure. The volume was 29 cc.  Given the pubic arch appearance and prostate volume, patient remains a good candidate to proceed with prostate seed implant. Today, he freely provided informed written consent to proceed.    PLAN: The patient will undergo prostate seed implant.   ________________________________  Sheral Apley. Tammi Klippel, M.D.

## 2019-01-27 ENCOUNTER — Telehealth: Payer: Self-pay | Admitting: *Deleted

## 2019-01-27 NOTE — Telephone Encounter (Signed)
CALLED PATIENT TO UPDATE, SPOKE WITH PATIENT 

## 2019-01-27 NOTE — Telephone Encounter (Signed)
Returned patient's call, spoke with patient 

## 2019-01-29 ENCOUNTER — Telehealth: Payer: Self-pay | Admitting: *Deleted

## 2019-01-29 NOTE — Telephone Encounter (Signed)
CALLED PATIENT TO GIVE AN UPDATE, SPOKE WITH PATIENT. 

## 2019-02-04 ENCOUNTER — Telehealth: Payer: Self-pay | Admitting: *Deleted

## 2019-02-04 NOTE — Telephone Encounter (Signed)
RETURNED PATIENT'S PHONE CALL, SPOKE WITH PATIENT. ?

## 2019-02-11 ENCOUNTER — Telehealth: Payer: Self-pay | Admitting: *Deleted

## 2019-02-11 ENCOUNTER — Other Ambulatory Visit: Payer: Self-pay | Admitting: Urology

## 2019-02-11 NOTE — Telephone Encounter (Signed)
CALLED PATIENT TO INFORM OF CHEST X-RAY AND EKG FOR 02-12-19 @ 10:15 AM @ [facility] ADMITTING, SPOKE WITH PATIENT AND HE IS AWARE OF THESE APPTS.

## 2019-02-11 NOTE — Telephone Encounter (Signed)
CALLED PATIENT TO INFORM OF IMPLANT DATE, SPOKE WITH PATIENT AND HE IS AWARE OF THIS IMPLANT

## 2019-02-12 ENCOUNTER — Other Ambulatory Visit: Payer: Self-pay

## 2019-02-12 ENCOUNTER — Encounter (HOSPITAL_COMMUNITY)
Admission: RE | Admit: 2019-02-12 | Discharge: 2019-02-12 | Disposition: A | Discharge status: Home / Self Care | Payer: BC Managed Care – PPO | Source: Ambulatory Visit | Attending: Urology | Admitting: Urology

## 2019-02-12 ENCOUNTER — Telehealth: Payer: Self-pay | Admitting: Medical Oncology

## 2019-02-12 ENCOUNTER — Ambulatory Visit (HOSPITAL_COMMUNITY)
Admission: RE | Admit: 2019-02-12 | Discharge: 2019-02-12 | Disposition: A | Discharge status: Home / Self Care | Payer: BC Managed Care – PPO | Source: Ambulatory Visit | Attending: Urology | Admitting: Urology

## 2019-02-12 DIAGNOSIS — C61 Malignant neoplasm of prostate: Secondary | ICD-10-CM | POA: Insufficient documentation

## 2019-02-12 NOTE — Telephone Encounter (Signed)
Enid Derry called patient 12/30 with brachytherapy date and he expressed concern about his date being 03/13/19. I spoke with him to reassure him, the risk of his cancer spreading is minimal when treated 3-6 months of diagnosis. We discussed his cancer is intermediate, based on his gleason and PSA, which is slow growing. All questions were answered and he voiced his appreciation for the call. He states he feels much better knowing the above. I asked him to call me with any further questions or concerns. He voiced understanding.

## 2019-02-26 ENCOUNTER — Other Ambulatory Visit: Payer: Self-pay | Admitting: Urology

## 2019-02-26 DIAGNOSIS — C61 Malignant neoplasm of prostate: Secondary | ICD-10-CM

## 2019-03-03 ENCOUNTER — Other Ambulatory Visit: Payer: Self-pay

## 2019-03-03 ENCOUNTER — Encounter (HOSPITAL_BASED_OUTPATIENT_CLINIC_OR_DEPARTMENT_OTHER): Payer: Self-pay | Admitting: Urology

## 2019-03-03 NOTE — H&P (Signed)
HPI: Nathan Cox is a 64 year-old male with prostate cancer.  His prostate cancer was diagnosed 12/11/2018. His PSA at his time of diagnosis was 5.98. His cancer was T1c, 4+3.   Prostate adenocarcinoma:  PSA - 5.98, DRE - benign.  TRUS/Bx 12/11/18: Prostate volume - 30 cc.  Pathology: 4+3 (90%) left base lateral, 3+4 (20%) left base medial and 3+3 (5%) left mid lateral. 3/12 cores positive.  Stage: T1c     CC/HPI: 12/16/18: He came in today because he noted over the weekend dysuria and slowing of his urinary stream. He was prescribed Bactrim and Flomax. He notes that he is still having some mild dysuria and still having some slowing of his stream. No hematuria or fever.     ALLERGIES: No Allergies    MEDICATIONS: Levaquin 750 mg tablet Take 1 pill the morning of your prostate biopsy.  Benzonatate 100 mg capsule  Ibuprofen 200 mg tablet  Meloxicam 15 mg tablet     GU PSH: Prostate Needle Biopsy - 12/11/2018     NON-GU PSH: Surgical Pathology, Gross And Microscopic Examination For Prostate Needle - 12/11/2018     GU PMH: Elevated PSA - 12/11/2018, (Stable), - 08/14/2018 (Stable), He is going to return to see me in 3 months for repeat PSA and then I will plan to see him in 6 months for DRE and PSA., - 09/27/2017, We discussed the possible causes of PSA elevation not associated with prostate cancer and he and his wife believe they may have had intercourse prior to having his PSA drawn. Because his PSA had been found elevated in the past and then fell back into the normal range and because he has not engaged in intercourse in several days we have decided to proceed with a repeat PSA today. If it remains elevated he said he would like to proceed with further evaluation with a prostate biopsy., - 2019 BPH w/o LUTS (Stable), He does have some BPH by exam. He has minimal voiding symptoms - 08/14/2018 Weak Urinary Stream (Stable), He has noted some decreased force of stream but really does not  have any other significant voiding symptoms with nocturia 0-1 time. I have not recommended any form of pharmacologic therapy - 08/14/2018 Ureteral calculus, Ureteral calculus, left - 2016      PMH Notes: History of calculus disease: He had a 3 mm left ureteral stone by CT scan in 2/16. He reported a history of stones prior to that.   NON-GU PMH: No Non-GU PMH    FAMILY HISTORY: Atrial Fibrillation - Mother Dementia - Father Family Health Status Number - Runs In Family Hypertension - Mother nephrolithiasis - Mother Urologic Disorder - Mother   SOCIAL HISTORY: Marital Status: Single Preferred Language: English; Ethnicity: Not Hispanic Or Latino; Race: White Current Smoking Status: Patient does not smoke anymore. Has not smoked since 03/16/2015. Smoked for 30 years. Smoked 1 pack per day.   Tobacco Use Assessment Completed: Used Tobacco in last 30 days? Light Drinker.  Drinks 2 caffeinated drinks per day. Patient's occupation Community education officer.     Notes: Alcohol Use, Occupation:, Tobacco Use, Marital History - Divorced   REVIEW OF SYSTEMS:    GU Review Male:   Patient reports burning/ pain with urination. Patient denies frequent urination, hard to postpone urination, get up at night to urinate, leakage of urine, stream starts and stops, trouble starting your stream, have to strain to urinate , erection problems, and penile pain.  Gastrointestinal (Upper):   Patient denies  nausea, indigestion/ heartburn, and vomiting.  Gastrointestinal (Lower):   Patient denies diarrhea and constipation.  Constitutional:   Patient denies fever, night sweats, weight loss, and fatigue.  Skin:   Patient denies skin rash/ lesion and itching.  Eyes:   Patient denies blurred vision and double vision.  Ears/ Nose/ Throat:   Patient denies sore throat and sinus problems.  Hematologic/Lymphatic:   Patient denies swollen glands and easy bruising.  Cardiovascular:   Patient denies leg swelling and chest pains.   Respiratory:   Patient denies cough and shortness of breath.  Endocrine:   Patient denies excessive thirst.  Musculoskeletal:   Patient denies back pain and joint pain.  Neurological:   Patient denies headaches and dizziness.  Psychologic:   Patient denies depression and anxiety.   VITAL SIGNS:    Weight 157 lb / 71.21 kg  Height 68 in / 172.72 cm  BP 132/83 mmHg  Pulse 103 /min  Temperature 97.3 F / 36.2 C  BMI 23.9 kg/m   PAST DATA REVIEWED:  Source Of History:  Patient  Lab Test Review:   Path Report  Records Review:   Previous Patient Records, POC Tool   11/16/18 08/15/18 05/02/18 01/31/18 06/28/17 03/28/17 02/22/17 04/25/06  PSA  Total PSA 5.98 ng/mL 5.36 ng/mL 4.50 ng/mL 4.53 ng/mL 4.15 ng/mL 4.76 ng/mL 6.49 ng/dl 4.5 ng/dl  Free PSA 0.46 ng/mL 0.49 ng/mL 0.39 ng/mL 0.44 ng/mL 0.39 ng/mL 0.46 ng/mL    % Free PSA 8 % PSA 9 % PSA 9 % PSA 10 % PSA 9 % PSA 10 % PSA     Notes:                     Partin table results:  5 and 10 year progression-free survival with radical prostatectomy is 70% and 50% respectively.  Organ confined disease: 55%  Extracapsular extension: 41%  Lymph node involvement: 7%  Seminal vesicle involvement: 6%     PROCEDURES: None   ASSESSMENTPLAN:      ICD-10 Details  1 GU:   Prostate Cancer - C61 I have given him information regarding treatment options but he is going to be scheduled for appoint with Dr. Tammi Klippel to discuss radiation.  2   Weak Urinary Stream - R39.12 Worsening - He has a weak stream and some dysuria. He is on Bactrim. I have recommended he increase his tamsulosin 0.8 mg.  3 NON-GU:   Pyuria/other UA findings - R82.79               Notes:   I went over his pathology report with him today as well as the significance of his Gleason score, number and location of cores positive and percent of cores positive. We then discussed his Partin table results in detail and the significance of these predictions as far as prognosis and need for  further workup are concerned. I then discussed with him the various options available including active surveillance and treatment for cure such as radiation and surgery. We briefly discussed the forms of radiation available. I also gave him written information outlining the disease, its workup and the options for treatment for him to review further.   We discussed his particular pathology results and the fact that he has unfavorable, intermediate risk but based on his pathology and PSA bone scan and CT scan imaging would not be indicated. I also told him that in my opinion active surveillance would not be wise.    He has  met with Dr. Tammi Klippel and has made the decision to proceed with treatment with radioactive seed implantation.  SpaceOAR will be used at the time of the procedure in order to reduce rectal toxicity.

## 2019-03-03 NOTE — Progress Notes (Signed)
Spoke with: Eloise NPO:  After Midnight, no gum, candy, or mints   Arrival time: 0845 AM Labs: (EKG, CXR, CBC, CMP, PT, PTT) AM medications: None (Fleet enema) Pre op orders: Yes Ride home:  Santiago Glad (girlfriend) [phone]

## 2019-03-05 ENCOUNTER — Encounter (HOSPITAL_COMMUNITY): Payer: 59

## 2019-03-05 ENCOUNTER — Encounter (HOSPITAL_COMMUNITY)
Admission: RE | Admit: 2019-03-05 | Discharge: 2019-03-05 | Disposition: A | Discharge status: Home / Self Care | Payer: 59 | Source: Ambulatory Visit | Attending: Urology | Admitting: Urology

## 2019-03-05 ENCOUNTER — Other Ambulatory Visit: Payer: Self-pay

## 2019-03-05 ENCOUNTER — Other Ambulatory Visit (HOSPITAL_COMMUNITY)
Admission: RE | Admit: 2019-03-05 | Discharge: 2019-03-05 | Disposition: A | Discharge status: Home / Self Care | Payer: 59 | Source: Ambulatory Visit | Attending: Urology | Admitting: Urology

## 2019-03-05 DIAGNOSIS — R300 Dysuria: Secondary | ICD-10-CM | POA: Diagnosis not present

## 2019-03-05 DIAGNOSIS — Z20822 Contact with and (suspected) exposure to covid-19: Secondary | ICD-10-CM | POA: Insufficient documentation

## 2019-03-05 DIAGNOSIS — C61 Malignant neoplasm of prostate: Secondary | ICD-10-CM | POA: Diagnosis not present

## 2019-03-05 DIAGNOSIS — Z87891 Personal history of nicotine dependence: Secondary | ICD-10-CM | POA: Diagnosis not present

## 2019-03-05 DIAGNOSIS — Z01812 Encounter for preprocedural laboratory examination: Secondary | ICD-10-CM | POA: Diagnosis present

## 2019-03-05 DIAGNOSIS — R3912 Poor urinary stream: Secondary | ICD-10-CM | POA: Diagnosis not present

## 2019-03-05 DIAGNOSIS — Z79899 Other long term (current) drug therapy: Secondary | ICD-10-CM | POA: Diagnosis not present

## 2019-03-05 LAB — CBC
HCT: 45.1 % (ref 39.0–52.0)
Hemoglobin: 14.3 g/dL (ref 13.0–17.0)
MCH: 27.8 pg (ref 26.0–34.0)
MCHC: 31.7 g/dL (ref 30.0–36.0)
MCV: 87.6 fL (ref 80.0–100.0)
Platelets: 332 10*3/uL (ref 150–400)
RBC: 5.15 MIL/uL (ref 4.22–5.81)
RDW: 13 % (ref 11.5–15.5)
WBC: 5.1 10*3/uL (ref 4.0–10.5)
nRBC: 0 % (ref 0.0–0.2)

## 2019-03-05 LAB — COMPREHENSIVE METABOLIC PANEL
ALT: 17 U/L (ref 0–44)
AST: 23 U/L (ref 15–41)
Albumin: 4.2 g/dL (ref 3.5–5.0)
Alkaline Phosphatase: 66 U/L (ref 38–126)
Anion gap: 8 (ref 5–15)
BUN: 21 mg/dL (ref 8–23)
CO2: 24 mmol/L (ref 22–32)
Calcium: 9 mg/dL (ref 8.9–10.3)
Chloride: 108 mmol/L (ref 98–111)
Creatinine, Ser: 1.13 mg/dL (ref 0.61–1.24)
GFR calc Af Amer: >60 mL/min (ref >60)
GFR calc non Af Amer: >60 mL/min (ref >60)
Glucose, Bld: 102 mg/dL — ABNORMAL HIGH (ref 70–99)
Potassium: 4 mmol/L (ref 3.5–5.1)
Sodium: 140 mmol/L (ref 135–145)
Total Bilirubin: 0.5 mg/dL (ref 0.3–1.2)
Total Protein: 7.3 g/dL (ref 6.5–8.1)

## 2019-03-05 LAB — PROTIME-INR
INR: 1 (ref 0.8–1.2)
Prothrombin Time: 12.7 seconds (ref 11.4–15.2)

## 2019-03-05 LAB — SARS CORONAVIRUS 2 (TAT 6-24 HRS): SARS Coronavirus 2: NEGATIVE

## 2019-03-05 LAB — APTT: aPTT: 33 seconds (ref 24–36)

## 2019-03-06 ENCOUNTER — Telehealth: Payer: Self-pay | Admitting: *Deleted

## 2019-03-06 NOTE — Telephone Encounter (Signed)
CALLED PATIENT TO REMIND OF IMPLANT FOR 03-09-19, SPOKE WITH PATIENT AND HE IS AWARE OF THIS PROCEDURE

## 2019-03-09 ENCOUNTER — Ambulatory Visit (HOSPITAL_BASED_OUTPATIENT_CLINIC_OR_DEPARTMENT_OTHER)
Admission: RE | Admit: 2019-03-09 | Discharge: 2019-03-09 | Disposition: A | Discharge status: Home / Self Care | Payer: 59 | Source: Ambulatory Visit | Attending: Urology | Admitting: Urology

## 2019-03-09 ENCOUNTER — Ambulatory Visit (HOSPITAL_COMMUNITY): Payer: 59

## 2019-03-09 ENCOUNTER — Encounter (HOSPITAL_BASED_OUTPATIENT_CLINIC_OR_DEPARTMENT_OTHER)
Admission: RE | Disposition: A | Discharge status: Home / Self Care | Payer: Self-pay | Source: Ambulatory Visit | Attending: Urology

## 2019-03-09 ENCOUNTER — Encounter (HOSPITAL_BASED_OUTPATIENT_CLINIC_OR_DEPARTMENT_OTHER): Payer: Self-pay | Admitting: Urology

## 2019-03-09 ENCOUNTER — Ambulatory Visit (HOSPITAL_BASED_OUTPATIENT_CLINIC_OR_DEPARTMENT_OTHER): Payer: 59 | Admitting: Anesthesiology

## 2019-03-09 DIAGNOSIS — C61 Malignant neoplasm of prostate: Secondary | ICD-10-CM | POA: Insufficient documentation

## 2019-03-09 DIAGNOSIS — Z79899 Other long term (current) drug therapy: Secondary | ICD-10-CM | POA: Insufficient documentation

## 2019-03-09 DIAGNOSIS — R300 Dysuria: Secondary | ICD-10-CM | POA: Insufficient documentation

## 2019-03-09 DIAGNOSIS — R3912 Poor urinary stream: Secondary | ICD-10-CM | POA: Insufficient documentation

## 2019-03-09 DIAGNOSIS — Z87891 Personal history of nicotine dependence: Secondary | ICD-10-CM | POA: Insufficient documentation

## 2019-03-09 HISTORY — DX: Presence of dental prosthetic device (complete) (partial): Z97.2

## 2019-03-09 HISTORY — DX: Carpal tunnel syndrome, unspecified upper limb: G56.00

## 2019-03-09 HISTORY — DX: Unspecified malignant neoplasm of skin, unspecified: C44.90

## 2019-03-09 HISTORY — PX: CYSTOSCOPY: SHX5120

## 2019-03-09 HISTORY — PX: SPACE OAR INSTILLATION: SHX6769

## 2019-03-09 HISTORY — DX: Personal history of urinary calculi: Z87.442

## 2019-03-09 HISTORY — PX: RADIOACTIVE SEED IMPLANT: SHX5150

## 2019-03-09 SURGERY — INSERTION, RADIATION SOURCE, PROSTATE
Anesthesia: General | Site: Rectum

## 2019-03-09 MED ORDER — ONDANSETRON HCL 4 MG/2ML IJ SOLN
4.0000 mg | Freq: Once | INTRAMUSCULAR | Status: DC | PRN
  Filled 2019-03-09: qty 2

## 2019-03-09 MED ORDER — PROPOFOL 10 MG/ML IV BOLUS
INTRAVENOUS | Status: DC | PRN
  Administered 2019-03-09: 160 mg via INTRAVENOUS

## 2019-03-09 MED ORDER — MIDAZOLAM HCL 2 MG/2ML IJ SOLN
INTRAMUSCULAR | Status: AC
  Filled 2019-03-09: qty 2

## 2019-03-09 MED ORDER — LACTATED RINGERS IV SOLN
INTRAVENOUS | Status: DC
  Filled 2019-03-09: qty 1000

## 2019-03-09 MED ORDER — FENTANYL CITRATE (PF) 100 MCG/2ML IJ SOLN
25.0000 ug | INTRAMUSCULAR | Status: DC | PRN
  Filled 2019-03-09: qty 1

## 2019-03-09 MED ORDER — CIPROFLOXACIN IN D5W 400 MG/200ML IV SOLN
INTRAVENOUS | Status: AC
  Filled 2019-03-09: qty 200

## 2019-03-09 MED ORDER — EPHEDRINE SULFATE-NACL 50-0.9 MG/10ML-% IV SOSY
PREFILLED_SYRINGE | INTRAVENOUS | Status: DC | PRN
  Administered 2019-03-09: 10 mg via INTRAVENOUS
  Administered 2019-03-09: 5 mg via INTRAVENOUS
  Administered 2019-03-09 (×2): 10 mg via INTRAVENOUS

## 2019-03-09 MED ORDER — MIDAZOLAM HCL 2 MG/2ML IJ SOLN
INTRAMUSCULAR | Status: DC | PRN
  Administered 2019-03-09: 2 mg via INTRAVENOUS

## 2019-03-09 MED ORDER — WHITE PETROLATUM EX OINT
TOPICAL_OINTMENT | CUTANEOUS | Status: AC
  Filled 2019-03-09: qty 5

## 2019-03-09 MED ORDER — FLEET ENEMA 7-19 GM/118ML RE ENEM
1.0000 | ENEMA | Freq: Once | RECTAL | Status: DC
  Filled 2019-03-09: qty 1

## 2019-03-09 MED ORDER — ONDANSETRON HCL 4 MG/2ML IJ SOLN
INTRAMUSCULAR | Status: AC
  Filled 2019-03-09: qty 2

## 2019-03-09 MED ORDER — ONDANSETRON HCL 4 MG/2ML IJ SOLN
INTRAMUSCULAR | Status: DC | PRN
  Administered 2019-03-09: 4 mg via INTRAVENOUS

## 2019-03-09 MED ORDER — FENTANYL CITRATE (PF) 100 MCG/2ML IJ SOLN
INTRAMUSCULAR | Status: DC | PRN
  Administered 2019-03-09: 25 ug via INTRAVENOUS
  Administered 2019-03-09: 50 ug via INTRAVENOUS
  Administered 2019-03-09: 25 ug via INTRAVENOUS

## 2019-03-09 MED ORDER — EPHEDRINE 5 MG/ML INJ
INTRAVENOUS | Status: AC
  Filled 2019-03-09: qty 10

## 2019-03-09 MED ORDER — SODIUM CHLORIDE 0.9 % IR SOLN
Status: DC | PRN
  Administered 2019-03-09: 1000 mL

## 2019-03-09 MED ORDER — OXYCODONE HCL 5 MG PO TABS
5.0000 mg | ORAL_TABLET | Freq: Once | ORAL | Status: DC | PRN
  Filled 2019-03-09: qty 1

## 2019-03-09 MED ORDER — FENTANYL CITRATE (PF) 100 MCG/2ML IJ SOLN
INTRAMUSCULAR | Status: AC
  Filled 2019-03-09: qty 2

## 2019-03-09 MED ORDER — SODIUM CHLORIDE (PF) 0.9 % IJ SOLN
INTRAMUSCULAR | Status: DC | PRN
  Administered 2019-03-09: 10 mL

## 2019-03-09 MED ORDER — HYDROCODONE-ACETAMINOPHEN 10-325 MG PO TABS: 1.0000 | ORAL_TABLET | ORAL | 0 refills | Status: AC | PRN

## 2019-03-09 MED ORDER — LIDOCAINE 2% (20 MG/ML) 5 ML SYRINGE
INTRAMUSCULAR | Status: DC | PRN
  Administered 2019-03-09: 100 mg via INTRAVENOUS

## 2019-03-09 MED ORDER — DEXAMETHASONE SODIUM PHOSPHATE 10 MG/ML IJ SOLN
INTRAMUSCULAR | Status: DC | PRN
  Administered 2019-03-09: 5 mg via INTRAVENOUS

## 2019-03-09 MED ORDER — IOHEXOL 300 MG/ML  SOLN
INTRAMUSCULAR | Status: DC | PRN
  Administered 2019-03-09: 7 mL

## 2019-03-09 MED ORDER — DEXAMETHASONE SODIUM PHOSPHATE 10 MG/ML IJ SOLN
INTRAMUSCULAR | Status: AC
  Filled 2019-03-09: qty 1

## 2019-03-09 MED ORDER — CIPROFLOXACIN IN D5W 400 MG/200ML IV SOLN
400.0000 mg | INTRAVENOUS | Status: AC
  Administered 2019-03-09: 400 mg via INTRAVENOUS
  Filled 2019-03-09: qty 200

## 2019-03-09 MED ORDER — OXYCODONE HCL 5 MG/5ML PO SOLN
5.0000 mg | Freq: Once | ORAL | Status: DC | PRN
  Filled 2019-03-09: qty 5

## 2019-03-09 SURGICAL SUPPLY — 41 items
BAG DRN RND TRDRP ANRFLXCHMBR (UROLOGICAL SUPPLIES) ×3
BAG URINE DRAIN 2000ML AR STRL (UROLOGICAL SUPPLIES) ×4 IMPLANT
BLADE CLIPPER SENSICLIP SURGIC (BLADE) ×4 IMPLANT
CATH FOLEY 2WAY SLVR  5CC 16FR (CATHETERS) ×1
CATH FOLEY 2WAY SLVR 5CC 16FR (CATHETERS) ×3 IMPLANT
CATH ROBINSON RED A/P 16FR (CATHETERS) IMPLANT
CATH ROBINSON RED A/P 20FR (CATHETERS) ×4 IMPLANT
CLOTH BEACON ORANGE TIMEOUT ST (SAFETY) ×4 IMPLANT
CONT SPEC 4OZ CLIKSEAL STRL BL (MISCELLANEOUS) ×8 IMPLANT
COVER BACK TABLE 60X90IN (DRAPES) ×4 IMPLANT
COVER MAYO STAND STRL (DRAPES) ×4 IMPLANT
DRSG TEGADERM 4X4.75 (GAUZE/BANDAGES/DRESSINGS) ×7 IMPLANT
DRSG TEGADERM 8X12 (GAUZE/BANDAGES/DRESSINGS) ×8 IMPLANT
GAUZE SPONGE 4X4 12PLY STRL (GAUZE/BANDAGES/DRESSINGS) ×1 IMPLANT
GLOVE BIO SURGEON STRL SZ 6.5 (GLOVE) ×3 IMPLANT
GLOVE BIO SURGEON STRL SZ7.5 (GLOVE) IMPLANT
GLOVE BIO SURGEON STRL SZ8 (GLOVE) ×4 IMPLANT
GLOVE BIOGEL PI IND STRL 6.5 (GLOVE) IMPLANT
GLOVE BIOGEL PI IND STRL 7.0 (GLOVE) IMPLANT
GLOVE BIOGEL PI IND STRL 7.5 (GLOVE) IMPLANT
GLOVE BIOGEL PI INDICATOR 6.5 (GLOVE) ×1
GLOVE BIOGEL PI INDICATOR 7.0 (GLOVE) ×2
GLOVE BIOGEL PI INDICATOR 7.5 (GLOVE) ×1
GLOVE SURG ORTHO 8.5 STRL (GLOVE) ×4 IMPLANT
GLOVE SURG SS PI 6.5 STRL IVOR (GLOVE) IMPLANT
GOWN STRL REUS W/TWL XL LVL3 (GOWN DISPOSABLE) ×4 IMPLANT
HOLDER FOLEY CATH W/STRAP (MISCELLANEOUS) IMPLANT
I SEED AGX100 ×1 IMPLANT
IMPL SPACEOAR SYSTEM 10ML (Spacer) ×3 IMPLANT
IMPLANT SPACEOAR SYSTEM 10ML (Spacer) ×4 IMPLANT
IV NS 1000ML (IV SOLUTION) ×4
IV NS 1000ML BAXH (IV SOLUTION) ×3 IMPLANT
KIT TURNOVER CYSTO (KITS) ×4 IMPLANT
MARKER SKIN DUAL TIP RULER LAB (MISCELLANEOUS) ×4 IMPLANT
PACK CYSTO (CUSTOM PROCEDURE TRAY) ×4 IMPLANT
SURGILUBE 2OZ TUBE FLIPTOP (MISCELLANEOUS) ×4 IMPLANT
SUT BONE WAX W31G (SUTURE) IMPLANT
SYR 10ML LL (SYRINGE) IMPLANT
TOWEL OR 17X26 10 PK STRL BLUE (TOWEL DISPOSABLE) ×4 IMPLANT
UNDERPAD 30X30 (UNDERPADS AND DIAPERS) ×8 IMPLANT
WATER STERILE IRR 500ML POUR (IV SOLUTION) ×4 IMPLANT

## 2019-03-09 NOTE — Anesthesia Procedure Notes (Signed)
Procedure Name: LMA Insertion Date/Time: 03/09/2019 11:05 AM Performed by: Suan Halter, CRNA Pre-anesthesia Checklist: Patient identified, Emergency Drugs available, Suction available and Patient being monitored Patient Re-evaluated:Patient Re-evaluated prior to induction Oxygen Delivery Method: Circle system utilized Preoxygenation: Pre-oxygenation with 100% oxygen Induction Type: IV induction Ventilation: Mask ventilation without difficulty LMA: LMA inserted LMA Size: 4.0 Number of attempts: 1 Airway Equipment and Method: Bite block Placement Confirmation: positive ETCO2 Tube secured with: Tape Dental Injury: Teeth and Oropharynx as per pre-operative assessment

## 2019-03-09 NOTE — Anesthesia Postprocedure Evaluation (Signed)
Anesthesia Post Note  Patient: [name]  Procedure(s) Performed: RADIOACTIVE SEED IMPLANT/BRACHYTHERAPY IMPLANT (N/A Prostate) SPACE OAR INSTILLATION (N/A Rectum) CYSTOSCOPY FLEXIBLE (Bladder)     Patient location during evaluation: PACU Anesthesia Type: General Level of consciousness: awake and alert Pain management: pain level controlled Vital Signs Assessment: post-procedure vital signs reviewed and stable Respiratory status: spontaneous breathing, nonlabored ventilation and respiratory function stable Cardiovascular status: blood pressure returned to baseline and stable Postop Assessment: no apparent nausea or vomiting Anesthetic complications: no    Last Vitals:  Vitals:   03/09/19 1300 03/09/19 1315  BP: (!) 158/98   Pulse: 84 82  Resp: 15 16  Temp:    SpO2: 99% 99%    Last Pain:  Vitals:   03/09/19 1315  TempSrc:   PainSc: 0-No pain                 Lidia Collum

## 2019-03-09 NOTE — Progress Notes (Signed)
  Radiation Oncology         [phone] ________________________________  Name: Nathan Cox MRN: [id number]  Date: 03/09/2019  DOB: [birth year]       Prostate Seed Implant  [id number], L.Marlou Sa, MD  No ref. provider found  DIAGNOSIS: 63 y.o. gentleman with Stage T1c adenocarcinoma of the prostate with Gleason score of 4+3, and PSA of 5.98.   ICD-10-CM   1. Prostate cancer ([place])  C61 DG Chest 2 View    DG Chest 2 View    PROCEDURE: Insertion of radioactive I-125 seeds into the prostate gland.  RADIATION DOSE: 145 Gy, definitivest therapy.  TECHNIQUE: Nathan Cox was brought to the operating room with the urologist. He was placed in the dorsolithotomy position. He was catheterized and a rectal tube was inserted. The perineum was shaved, prepped and draped. The ultrasound probe was then introduced into the rectum to see the prostate gland.  TREATMENT DEVICE: A needle grid was attached to the ultrasound probe stand and anchor needles were placed.  3D PLANNING: The prostate was imaged in 3D using a sagittal sweep of the prostate probe. These images were transferred to the planning computer. There, the prostate, urethra and rectum were defined on each axial reconstructed image. Then, the software created an optimized 3D plan and a few seed positions were adjusted. The quality of the plan was reviewed using [facility] information for the target and the following two organs at risk:  Urethra and Rectum.  Then the accepted plan was printed and handed off to the radiation therapist.  Under my supervision, the custom loading of the seeds and spacers was carried out and loaded into sealed vicryl sleeves.  These pre-loaded needles were then placed into the needle holder.Marland Kitchen  PROSTATE VOLUME STUDY:  Using transrectal ultrasound the volume of the prostate was verified to be 32 cc.  SPECIAL TREATMENT PROCEDURE/SUPERVISION AND HANDLING: The pre-loaded needles were then delivered under sagittal  guidance. A total of 24 needles were used to deposit 67 seeds in the prostate gland. The individual seed activity was 0.397 mCi.  SpaceOAR:  Yes  COMPLEX SIMULATION: At the end of the procedure, an anterior radiograph of the pelvis was obtained to document seed positioning and count. Cystoscopy was performed to check the urethra and bladder.  MICRODOSIMETRY: At the end of the procedure, the patient was emitting 0.2 mR/hr at 1 meter. Accordingly, he was considered safe for hospital discharge.  PLAN: The patient will return to the radiation oncology clinic for post implant CT dosimetry in three weeks.   ________________________________  Sheral Apley Tammi Klippel, M.D.

## 2019-03-09 NOTE — Discharge Instructions (Signed)

## 2019-03-09 NOTE — Transfer of Care (Signed)
Immediate Anesthesia Transfer of Care Note  Patient: Nathan Cox  Procedure(s) Performed: Procedure(s) (LRB): RADIOACTIVE SEED IMPLANT/BRACHYTHERAPY IMPLANT (N/A) SPACE OAR INSTILLATION (N/A) CYSTOSCOPY FLEXIBLE  Patient Location: PACU  Anesthesia Type: General  Level of Consciousness: awake, oriented, sedated and patient cooperative  Airway & Oxygen Therapy: Patient Spontanous Breathing and Patient connected to face mask oxygen  Post-op Assessment: Report given to PACU RN and Post -op Vital signs reviewed and stable  Post vital signs: Reviewed and stable  Complications: No apparent anesthesia complications  Last Vitals:  Vitals Value Taken Time  BP    Temp    Pulse 97 03/09/19 1220  Resp 18 03/09/19 1220  SpO2 96 % 03/09/19 1220  Vitals shown include unvalidated device data.  Last Pain:  Vitals:   03/09/19 0857  TempSrc: Oral  PainSc: 0-No pain      Patients Stated Pain Goal: 5 (03/09/19 0857)

## 2019-03-09 NOTE — Anesthesia Preprocedure Evaluation (Addendum)
Anesthesia Evaluation  Patient identified by MRN, date of birth, ID band Patient awake    Reviewed: Allergy & Precautions, NPO status , Patient's Chart, lab work & pertinent test results  History of Anesthesia Complications Negative for: history of anesthetic complications  Airway Mallampati: II  TM Distance: >3 FB Neck ROM: Full    Dental  (+) Upper Dentures, Partial Lower   Pulmonary neg pulmonary ROS, former smoker,    Pulmonary exam normal        Cardiovascular negative cardio ROS Normal cardiovascular exam     Neuro/Psych negative neurological ROS  negative psych ROS   GI/Hepatic negative GI ROS, Neg liver ROS,   Endo/Other  negative endocrine ROS  Renal/GU negative Renal ROS   Prostate cancer    Musculoskeletal negative musculoskeletal ROS (+)   Abdominal   Peds  Hematology negative hematology ROS (+)   Anesthesia Other Findings   Reproductive/Obstetrics                            Anesthesia Physical Anesthesia Plan  ASA: II  Anesthesia Plan: General   Post-op Pain Management:    Induction: Intravenous  PONV Risk Score and Plan: 2 and Ondansetron, Dexamethasone, Treatment may vary due to age or medical condition and Midazolam  Airway Management Planned: LMA  Additional Equipment: None  Intra-op Plan:   Post-operative Plan: Extubation in OR  Informed Consent: I have reviewed the patients History and Physical, chart, labs and discussed the procedure including the risks, benefits and alternatives for the proposed anesthesia with the patient or authorized representative who has indicated his/her understanding and acceptance.     Dental advisory given  Plan Discussed with:   Anesthesia Plan Comments:        Anesthesia Quick Evaluation

## 2019-03-09 NOTE — Op Note (Signed)
PATIENT:  Nathan Cox  PRE-OPERATIVE DIAGNOSIS:  Adenocarcinoma of the prostate  POST-OPERATIVE DIAGNOSIS:  Same  PROCEDURE:  1. I-125 radioactive seed implantation 2. Cystoscopy  3. Placement of SpaceOAR 4. Fluoroscopy use with time less than 1 hour.  SURGEON:  Surgeon(s): Claybon Jabs  Radiation oncologist: Dr. Tyler Pita  ANESTHESIA:  General  EBL:  Minimal  DRAINS: None  INDICATION: Nathan Cox is a 64 year old male with biopsy-proven adenocarcinoma the prostate.  He is elected to proceed with radioactive seed implant.  Description of procedure: After informed consent the patient was brought to the major OR, placed on the table and administered general anesthesia. He was then moved to the modified lithotomy position with his perineum perpendicular to the floor. His perineum and genitalia were then sterilely prepped. An official timeout was then performed. A 16 French Foley catheter was then placed in the bladder and filled with dilute contrast, a rectal tube was placed in the rectum and the transrectal ultrasound probe was placed in the rectum and affixed to the stand. He was then sterilely draped.  Real time ultrasonography was used along with the seed planning software [employer] Prostate vs. 4.2.2.4. This was used to develop the seed plan including the number of needles as well as number of seeds required for complete and adequate coverage.  The needles were then preloaded with seeds and spacers according to the previously developed plan.  Real-time ultrasonography was then used along with the previously developed plan to implant a total of 67 seeds using 24 needles. This proceeded without difficulty or complication.   I then proceeded with placement of SpaceOAR by introducing a needle with the bevel angled inferiorly approximately 2 cm superior to the anus. This was angled downward and under direct ultrasound was placed within the space between the prostatic  capsule and rectum. This was confirmed with a small amount of sterile saline injected and this was performed under direct ultrasound. I then attached the SpaceOAR to the needle and injected this in the space between the prostate and rectum with good placement noted.  A Foley catheter was then removed as well as the transrectal ultrasound probe and rectal probe. Flexible cystoscopy was then performed using the 17 French flexible scope which revealed a normal urethra throughout its length down to the sphincter which appeared intact. The prostatic urethra revealed bilobar hypertrophy but no evidence of obstruction, seeds, spacers or lesions. The bladder was then entered and fully and systematically inspected. The ureteral orifices were noted to be of normal configuration and position. The mucosa revealed no evidence of tumors. There were also no stones identified within the bladder. I noted no seeds or spacers on the floor of the bladder and retroflexion of the scope revealed no seeds protruding from the base of the prostate.  C-arm fluoroscopy was then used to evaluate the distribution of seeds placement and aid in determining confirmation of the number of seeds placed.  Real-time fluoroscopy was used with saved images revealing the location of the seeds placed both in AP and oblique views.  The cystoscope was then removed and the patient was awakened and taken to recovery room in stable and satisfactory condition. He tolerated procedure well and there were no intraoperative complications.

## 2019-03-19 ENCOUNTER — Ambulatory Visit: Payer: 59 | Admitting: Radiation Oncology

## 2019-03-19 ENCOUNTER — Ambulatory Visit: Payer: Self-pay | Admitting: Urology

## 2019-03-23 ENCOUNTER — Encounter: Payer: Self-pay | Admitting: Medical Oncology

## 2019-03-25 ENCOUNTER — Telehealth: Payer: Self-pay | Admitting: *Deleted

## 2019-03-25 NOTE — Telephone Encounter (Signed)
XXXX 

## 2019-03-25 NOTE — Telephone Encounter (Signed)
CALLED PATIENT TO REMIND OF POST SEED APPTS. FOR 03-26-19, LVM FOR A RETURN CALL

## 2019-03-26 ENCOUNTER — Ambulatory Visit (HOSPITAL_COMMUNITY)
Admission: RE | Admit: 2019-03-26 | Discharge: 2019-03-26 | Disposition: A | Discharge status: Home / Self Care | Payer: 59 | Source: Ambulatory Visit | Attending: Urology | Admitting: Urology

## 2019-03-26 ENCOUNTER — Ambulatory Visit
Admission: RE | Admit: 2019-03-26 | Discharge: 2019-03-26 | Disposition: A | Discharge status: Home / Self Care | Payer: 59 | Source: Ambulatory Visit | Attending: Urology | Admitting: Urology

## 2019-03-26 ENCOUNTER — Encounter: Payer: Self-pay | Admitting: Medical Oncology

## 2019-03-26 ENCOUNTER — Encounter: Payer: Self-pay | Admitting: Urology

## 2019-03-26 ENCOUNTER — Other Ambulatory Visit: Payer: Self-pay

## 2019-03-26 VITALS — BP 148/93 | HR 82 | Temp 98.3°F | Resp 20

## 2019-03-26 DIAGNOSIS — C61 Malignant neoplasm of prostate: Secondary | ICD-10-CM

## 2019-03-26 NOTE — Progress Notes (Signed)
Radiation Oncology         [phone] ________________________________  Name: Nathan Cox MRN: [id number]  Date: 03/26/2019  DOB: 02-Aug-1955  Post-Seed Follow-Up Visit Note  CC: Alroy Dust, L.Marlou Sa, MD  Alroy Dust, L.Marlou Sa, MD  Diagnosis:   64 y.o. gentleman with Stage T1c adenocarcinoma of the prostate with Gleason score of 4+3, and PSA of 5.98.    ICD-10-CM   1. Malignant neoplasm of prostate ([place])  C61     Interval Since Last Radiation:  2.5 weeks 03/09/19:  Insertion of radioactive I-125 seeds into the prostate gland; 145 Gy, definitive therapy with placement of SpaceOAR gel.  Narrative:  The patient returns today for routine follow-up.  He is complaining of increased urinary frequency and urinary hesitation symptoms. He filled out a questionnaire regarding urinary function today providing and overall IPSS score of 13 characterizing his symptoms as moderate with weak stream, intermittency and nocturia x2/night. He is taking 0.8 mg Flomax daily. He specifically denies dysuria, gross hematuria, straining to void, incomplete emptying, excessive daytime frequency, urgency or incontinence. His pre-implant score was 3. He denies any abdominal pain or bowel symptoms.  He reports a healthy appetite and denies fatigue. Overall, he is quite pleased with his progress to date.  ALLERGIES:  has No Known Allergies.  Meds: Current Outpatient Medications  Medication Sig Dispense Refill  . HYDROcodone-acetaminophen (NORCO) 10-325 MG tablet Take 1-2 tablets by mouth every 4 (four) hours as needed for moderate pain. Maximum dose per 24 hours - 8 pills 20 tablet 0  . tamsulosin (FLOMAX) 0.4 MG CAPS capsule Take by mouth at bedtime.      No current facility-administered medications for this encounter.    Physical Findings: In general this is a well appearing Caucasian male in no acute distress. He's alert and oriented x4 and appropriate throughout the examination. Cardiopulmonary assessment is  negative for acute distress and he exhibits normal effort.   Lab Findings: Lab Results  Component Value Date   WBC 5.1 03/05/2019   HGB 14.3 03/05/2019   HCT 45.1 03/05/2019   MCV 87.6 03/05/2019   PLT 332 03/05/2019    Radiographic Findings:  Patient underwent CT imaging in our clinic for post implant dosimetry. The CT will be reviewed by Dr. Tammi Klippel to confirm there is an adequate distribution of radioactive seeds throughout the prostate gland and ensure that there are no seeds in or near the rectum. His scheduled for prostate MRI tonight at 8pm and those images will be fused with his CT images for further evaluation. We suspect the final radiation plan and dosimetry will show appropriate coverage of the prostate gland. He understands that we will call and inform him of any unexpected findings on further review of his imaging and dosimetry.  Impression/Plan: 64 y.o. gentleman with Stage T1c adenocarcinoma of the prostate with Gleason score of 4+3, and PSA of 5.98. The patient is recovering from the effects of radiation. His urinary symptoms should gradually improve over the next 4-6 months. We talked about this today. He is encouraged by his improvement already and is otherwise pleased with his outcome. We also talked about long-term follow-up for prostate cancer following seed implant. He understands that ongoing PSA determinations and digital rectal exams will help perform surveillance to rule out disease recurrence. He has a follow up appointment scheduled with Jiles Crocker, NP on 04/02/19 and will see Dr. Karsten Ro in 05/2019 for follow up PSA. He understands what to expect with his PSA measures. Patient was also  educated today about some of the long-term effects from radiation including a small risk for rectal bleeding and possibly erectile dysfunction. We talked about some of the general management approaches to these potential complications. However, I did encourage the patient to contact our  office or return at any point if he has questions or concerns related to his previous radiation and prostate cancer.    Nicholos Johns, PA-C

## 2019-03-26 NOTE — Progress Notes (Signed)
Patient in today for Post Seed appointment. Denies any pain or discomfort to abdomen or lower back. No dysuria, denies hematuria and urgency. No issues with bowels. Nocturia x 2-3 times a night. Does not feel he is emptying his bladder. No leakage noted. Follow up with urologist 04/02/19. MRI is scheduled for tonight at 8pm.  Vitals:   03/26/19 0930  BP: (!) 148/93  Pulse: 82  Resp: 20  Temp: 98.3 F (36.8 C)  SpO2: 98%

## 2019-03-28 NOTE — Progress Notes (Signed)
  Radiation Oncology         [phone]) [phone] ________________________________  Name: Nathan Cox MRN: [id number]  Date: 03/26/2019  DOB: 1955-10-23  COMPLEX SIMULATION NOTE  NARRATIVE:  The patient was brought to the [facility] today following prostate seed implantation approximately one month ago.  Identity was confirmed.  All relevant records and images related to the planned course of therapy were reviewed.  Then, the patient was set-up supine.  CT images were obtained.  The CT images were loaded into the planning software.  Then the prostate and rectum were contoured.  Treatment planning then occurred.  The implanted iodine 125 seeds were identified by the physics staff for projection of radiation distribution  I have requested : 3D Simulation  I have requested a DVH of the following structures: Prostate and rectum.    ________________________________  Sheral Apley Tammi Klippel, M.D.

## 2019-04-21 ENCOUNTER — Encounter: Payer: Self-pay | Admitting: Radiation Oncology

## 2019-04-21 ENCOUNTER — Ambulatory Visit: Payer: 59 | Attending: Urology | Admitting: Radiation Oncology

## 2019-04-21 DIAGNOSIS — C61 Malignant neoplasm of prostate: Secondary | ICD-10-CM | POA: Insufficient documentation

## 2019-04-28 NOTE — Progress Notes (Signed)
  Radiation Oncology         [phone] ________________________________  Name: Nathan Cox MRN: [id number]  Date: 04/21/2019  DOB: [birth year]  3D Planning Note   Prostate Brachytherapy Post-Implant Dosimetry  Diagnosis: 64 y.o. gentleman with Stage T1c adenocarcinoma of the prostate with Gleason score of 4+3, and PSA of 5.98.  Narrative: On a previous date, Nathan Cox returned following prostate seed implantation for post implant planning. He underwent CT scan complex simulation to delineate the three-dimensional structures of the pelvis and demonstrate the radiation distribution.  Since that time, the seed localization, and complex isodose planning with dose volume histograms have now been completed.  Results:   Prostate Coverage - The dose of radiation delivered to the 90% or more of the prostate gland (D90) was 97.5% of the prescription dose. This exceeds our goal of greater than 90%. Rectal Sparing - The volume of rectal tissue receiving the prescription dose or higher was 0.0 cc. This falls under our thresholds tolerance of 1.0 cc.  Impression: The prostate seed implant appears to show adequate target coverage and appropriate rectal sparing.  Plan:  The patient will continue to follow with urology for ongoing PSA determinations. I would anticipate a high likelihood for local tumor control with minimal risk for rectal morbidity.  ________________________________  Sheral Apley Tammi Klippel, M.D.

## 2020-05-05 DIAGNOSIS — E7800 Pure hypercholesterolemia, unspecified: Secondary | ICD-10-CM | POA: Diagnosis not present

## 2020-05-05 DIAGNOSIS — Z0000 Encounter for general adult medical examination without abnormal findings: Secondary | ICD-10-CM | POA: Diagnosis not present

## 2020-06-14 DIAGNOSIS — R030 Elevated blood-pressure reading, without diagnosis of hypertension: Secondary | ICD-10-CM | POA: Diagnosis not present

## 2020-07-05 DIAGNOSIS — R3912 Poor urinary stream: Secondary | ICD-10-CM | POA: Diagnosis not present

## 2020-11-25 DIAGNOSIS — Z20822 Contact with and (suspected) exposure to covid-19: Secondary | ICD-10-CM | POA: Diagnosis not present

## 2020-11-25 DIAGNOSIS — Z03818 Encounter for observation for suspected exposure to other biological agents ruled out: Secondary | ICD-10-CM | POA: Diagnosis not present

## 2020-12-21 DIAGNOSIS — J208 Acute bronchitis due to other specified organisms: Secondary | ICD-10-CM | POA: Diagnosis not present

## 2021-03-30 DIAGNOSIS — Z1211 Encounter for screening for malignant neoplasm of colon: Secondary | ICD-10-CM | POA: Diagnosis not present

## 2021-03-30 DIAGNOSIS — D125 Benign neoplasm of sigmoid colon: Secondary | ICD-10-CM | POA: Diagnosis not present

## 2021-03-30 DIAGNOSIS — D128 Benign neoplasm of rectum: Secondary | ICD-10-CM | POA: Diagnosis not present

## 2021-03-30 DIAGNOSIS — K648 Other hemorrhoids: Secondary | ICD-10-CM | POA: Diagnosis not present

## 2021-04-04 DIAGNOSIS — D125 Benign neoplasm of sigmoid colon: Secondary | ICD-10-CM | POA: Diagnosis not present

## 2021-05-16 DIAGNOSIS — Z23 Encounter for immunization: Secondary | ICD-10-CM | POA: Diagnosis not present

## 2021-05-16 DIAGNOSIS — E7800 Pure hypercholesterolemia, unspecified: Secondary | ICD-10-CM | POA: Diagnosis not present

## 2021-05-16 DIAGNOSIS — Z0000 Encounter for general adult medical examination without abnormal findings: Secondary | ICD-10-CM | POA: Diagnosis not present

## 2021-09-17 IMAGING — DX DG CHEST 2V
2 series · 2 of 2 positions shown · non-contrast
Comparison: PA and lateral chest 04/12/2015.

CLINICAL DATA: Preoperative examination. Patient for prostate
surgery.

EXAM:
CHEST - 2 VIEW

[chest pa]
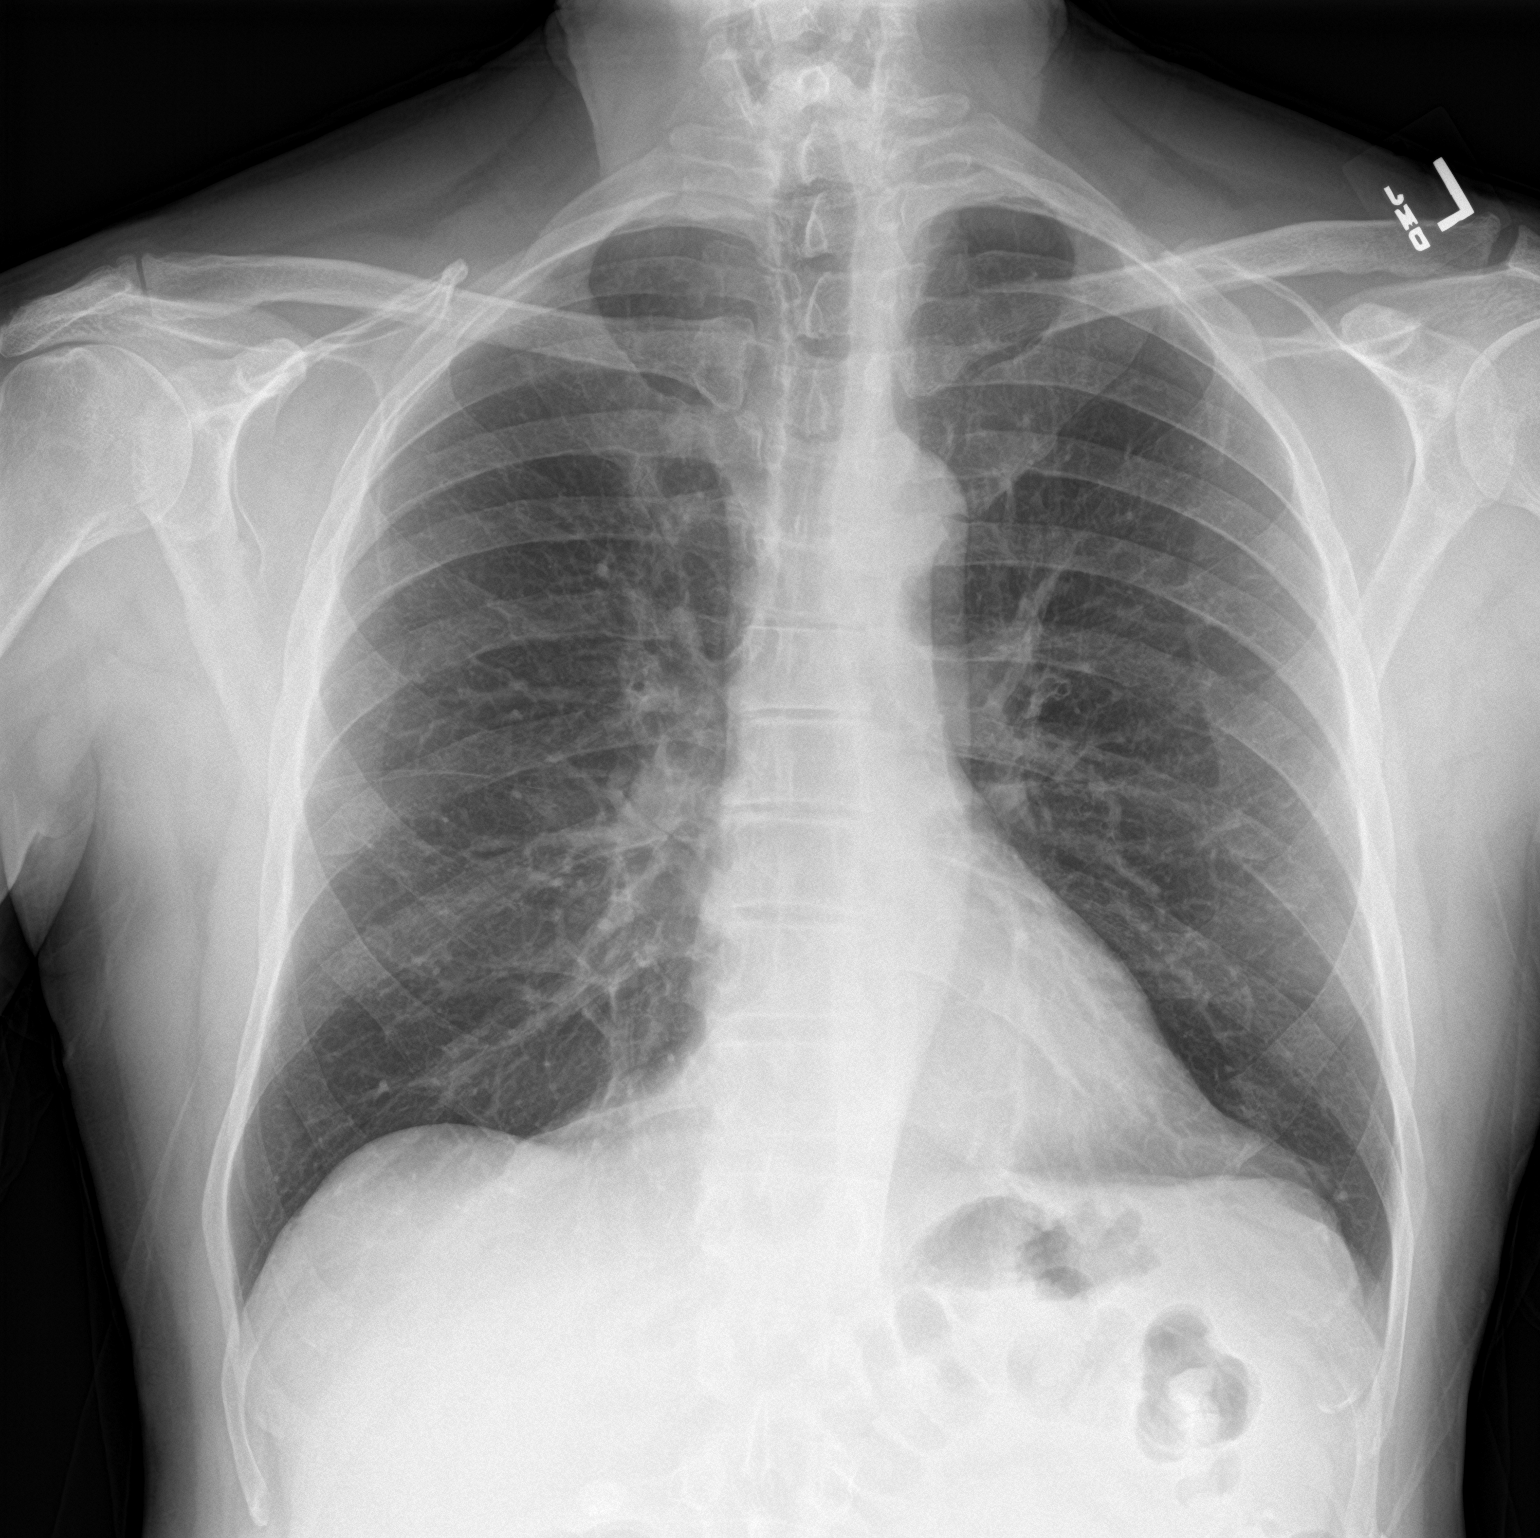

[chest lat]
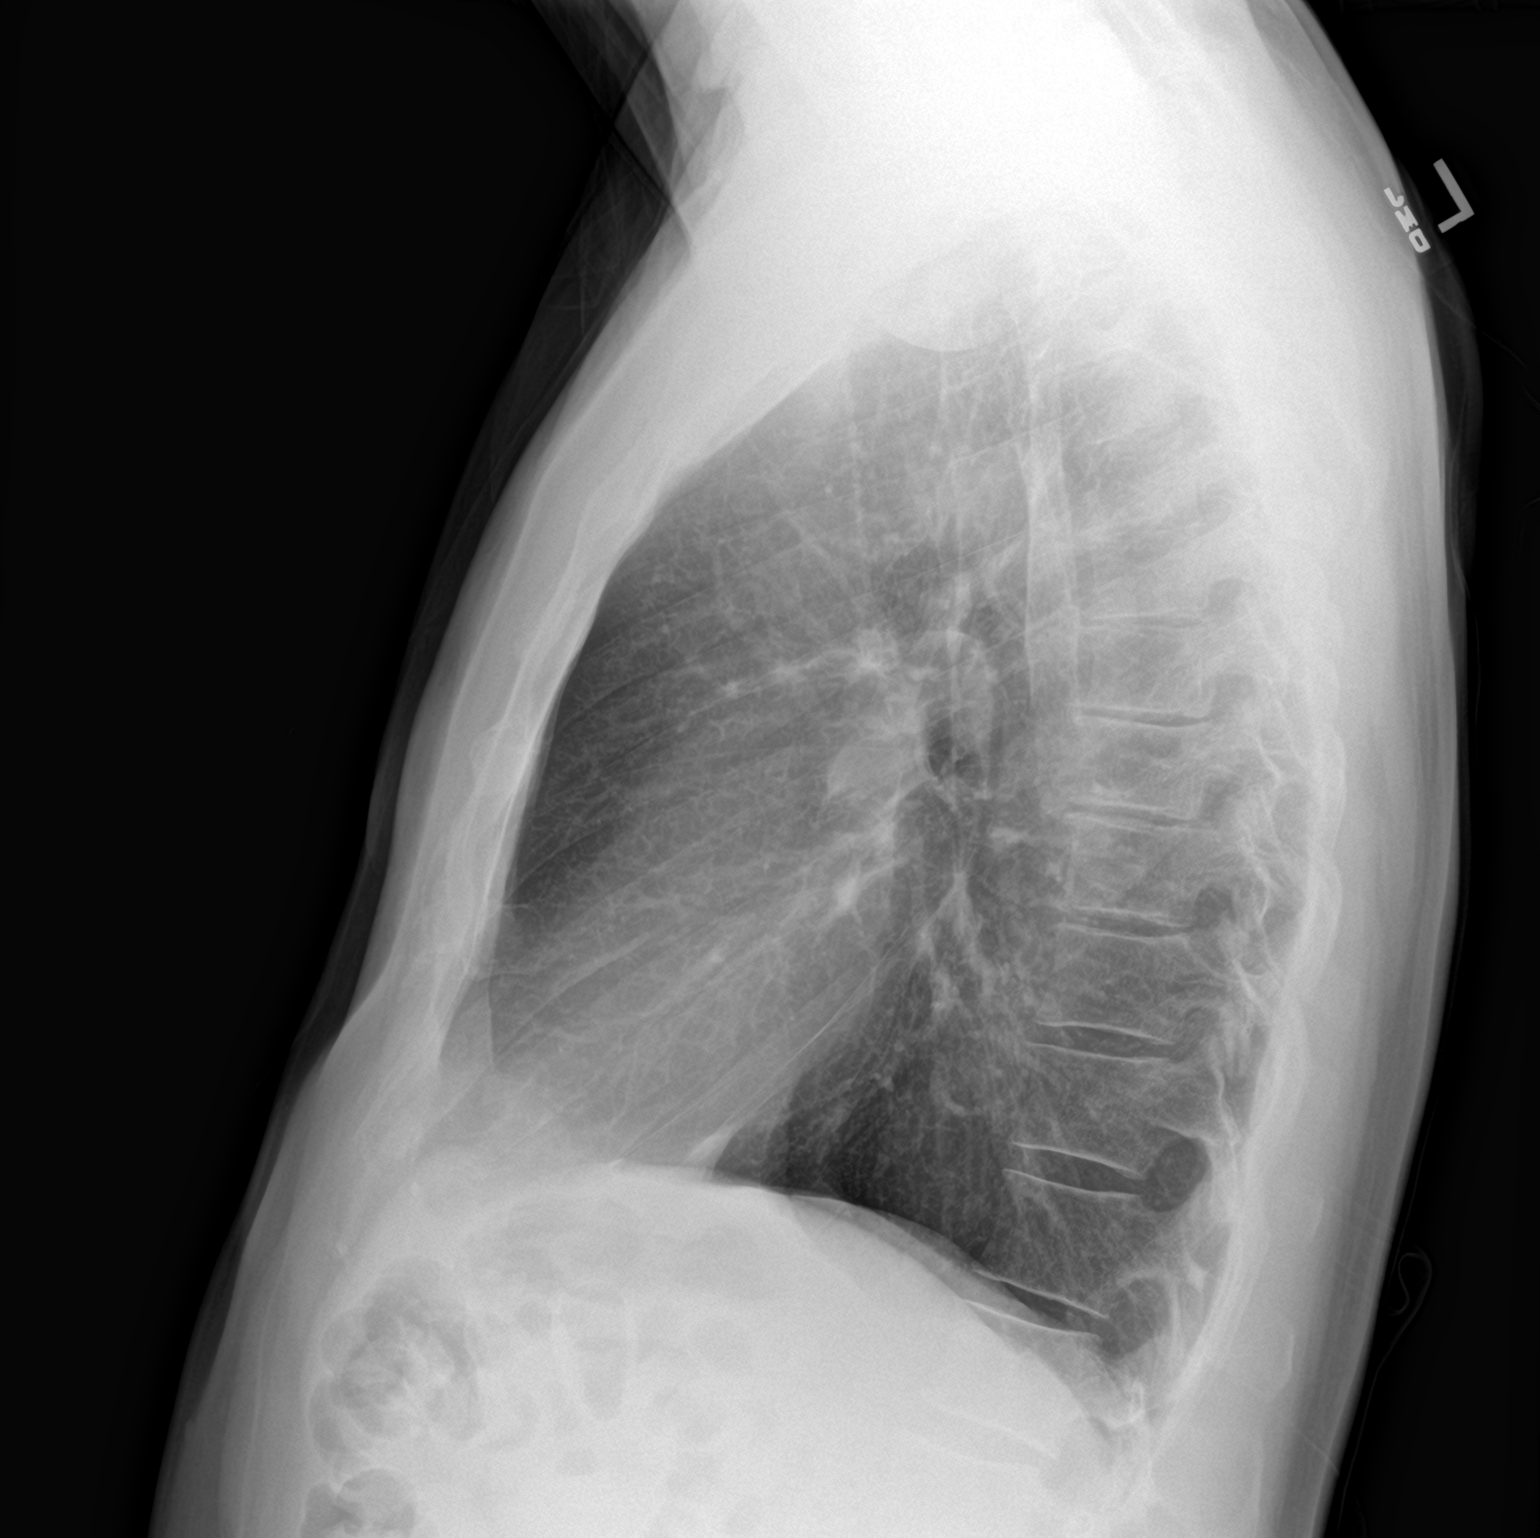

[2 of 2 positions shown; findings below may reference images not displayed]

FINDINGS: Lungs are clear. Heart size is normal. No pneumothorax or pleural
fluid. No acute or focal bony abnormality.
IMPRESSION: Negative chest.

## 2021-11-08 ENCOUNTER — Ambulatory Visit
Admission: RE | Admit: 2021-11-08 | Discharge: 2021-11-08 | Disposition: A | Discharge status: Home / Self Care | Payer: Medicare Other | Source: Ambulatory Visit | Attending: Family Medicine | Admitting: Family Medicine

## 2021-11-08 ENCOUNTER — Other Ambulatory Visit: Payer: Self-pay | Admitting: Family Medicine

## 2021-11-08 DIAGNOSIS — R0600 Dyspnea, unspecified: Secondary | ICD-10-CM | POA: Diagnosis not present

## 2021-11-08 DIAGNOSIS — R062 Wheezing: Secondary | ICD-10-CM | POA: Diagnosis not present

## 2021-11-08 DIAGNOSIS — G9331 Postviral fatigue syndrome: Secondary | ICD-10-CM | POA: Diagnosis not present

## 2022-05-21 DIAGNOSIS — Z0000 Encounter for general adult medical examination without abnormal findings: Secondary | ICD-10-CM | POA: Diagnosis not present

## 2022-05-21 DIAGNOSIS — E7800 Pure hypercholesterolemia, unspecified: Secondary | ICD-10-CM | POA: Diagnosis not present

## 2022-05-21 DIAGNOSIS — R911 Solitary pulmonary nodule: Secondary | ICD-10-CM | POA: Diagnosis not present

## 2022-05-21 DIAGNOSIS — Z136 Encounter for screening for cardiovascular disorders: Secondary | ICD-10-CM | POA: Diagnosis not present

## 2022-05-22 ENCOUNTER — Other Ambulatory Visit: Payer: Self-pay | Admitting: Family Medicine

## 2022-05-22 DIAGNOSIS — R911 Solitary pulmonary nodule: Secondary | ICD-10-CM

## 2022-05-23 ENCOUNTER — Other Ambulatory Visit: Payer: Self-pay | Admitting: Family Medicine

## 2022-05-23 DIAGNOSIS — Z87891 Personal history of nicotine dependence: Secondary | ICD-10-CM

## 2022-05-23 DIAGNOSIS — Z136 Encounter for screening for cardiovascular disorders: Secondary | ICD-10-CM

## 2022-06-01 ENCOUNTER — Ambulatory Visit
Admission: RE | Admit: 2022-06-01 | Discharge: 2022-06-01 | Disposition: A | Discharge status: Home / Self Care | Payer: Medicare Other | Source: Ambulatory Visit | Attending: Family Medicine | Admitting: Family Medicine

## 2022-06-01 DIAGNOSIS — R911 Solitary pulmonary nodule: Secondary | ICD-10-CM

## 2022-06-01 DIAGNOSIS — R918 Other nonspecific abnormal finding of lung field: Secondary | ICD-10-CM | POA: Diagnosis not present

## 2022-06-01 DIAGNOSIS — I700 Atherosclerosis of aorta: Secondary | ICD-10-CM | POA: Diagnosis not present

## 2022-06-14 ENCOUNTER — Ambulatory Visit
Admission: RE | Admit: 2022-06-14 | Discharge: 2022-06-14 | Disposition: A | Discharge status: Home / Self Care | Payer: Medicare Other | Source: Ambulatory Visit | Attending: Family Medicine | Admitting: Family Medicine

## 2022-06-14 DIAGNOSIS — Z136 Encounter for screening for cardiovascular disorders: Secondary | ICD-10-CM

## 2022-06-14 DIAGNOSIS — Z87891 Personal history of nicotine dependence: Secondary | ICD-10-CM

## 2022-06-28 DIAGNOSIS — E7800 Pure hypercholesterolemia, unspecified: Secondary | ICD-10-CM | POA: Diagnosis not present

## 2022-07-26 DIAGNOSIS — R3912 Poor urinary stream: Secondary | ICD-10-CM | POA: Diagnosis not present

## 2022-11-21 DIAGNOSIS — E7800 Pure hypercholesterolemia, unspecified: Secondary | ICD-10-CM | POA: Diagnosis not present

## 2022-11-21 DIAGNOSIS — I77810 Thoracic aortic ectasia: Secondary | ICD-10-CM | POA: Diagnosis not present

## 2022-11-21 DIAGNOSIS — I10 Essential (primary) hypertension: Secondary | ICD-10-CM | POA: Diagnosis not present

## 2022-11-21 DIAGNOSIS — R911 Solitary pulmonary nodule: Secondary | ICD-10-CM | POA: Diagnosis not present

## 2022-11-21 DIAGNOSIS — I700 Atherosclerosis of aorta: Secondary | ICD-10-CM | POA: Diagnosis not present

## 2022-11-21 DIAGNOSIS — E785 Hyperlipidemia, unspecified: Secondary | ICD-10-CM | POA: Diagnosis not present

## 2022-12-19 DIAGNOSIS — I10 Essential (primary) hypertension: Secondary | ICD-10-CM | POA: Diagnosis not present

## 2022-12-19 DIAGNOSIS — E7800 Pure hypercholesterolemia, unspecified: Secondary | ICD-10-CM | POA: Diagnosis not present

## 2023-01-18 DIAGNOSIS — I10 Essential (primary) hypertension: Secondary | ICD-10-CM | POA: Diagnosis not present

## 2023-01-24 DIAGNOSIS — H04123 Dry eye syndrome of bilateral lacrimal glands: Secondary | ICD-10-CM | POA: Diagnosis not present

## 2023-05-14 ENCOUNTER — Other Ambulatory Visit: Payer: Self-pay | Admitting: Family Medicine

## 2023-05-14 DIAGNOSIS — R911 Solitary pulmonary nodule: Secondary | ICD-10-CM

## 2023-05-14 DIAGNOSIS — I77810 Thoracic aortic ectasia: Secondary | ICD-10-CM

## 2023-05-20 ENCOUNTER — Ambulatory Visit
Admission: RE | Admit: 2023-05-20 | Discharge: 2023-05-20 | Disposition: A | Discharge status: Home / Self Care | Source: Ambulatory Visit | Attending: Family Medicine | Admitting: Family Medicine

## 2023-05-20 ENCOUNTER — Encounter: Payer: Self-pay | Admitting: Radiology

## 2023-05-20 DIAGNOSIS — I77810 Thoracic aortic ectasia: Secondary | ICD-10-CM

## 2023-05-20 DIAGNOSIS — R911 Solitary pulmonary nodule: Secondary | ICD-10-CM | POA: Diagnosis not present

## 2023-05-20 MED ORDER — IOPAMIDOL (ISOVUE-370) INJECTION 76%
75.0000 mL | Freq: Once | INTRAVENOUS | Status: AC | PRN
  Administered 2023-05-20: 75 mL via INTRAVENOUS

## 2023-05-30 DIAGNOSIS — Z0000 Encounter for general adult medical examination without abnormal findings: Secondary | ICD-10-CM | POA: Diagnosis not present

## 2023-05-30 DIAGNOSIS — I10 Essential (primary) hypertension: Secondary | ICD-10-CM | POA: Diagnosis not present

## 2023-05-30 DIAGNOSIS — R5383 Other fatigue: Secondary | ICD-10-CM | POA: Diagnosis not present

## 2023-05-30 DIAGNOSIS — I77810 Thoracic aortic ectasia: Secondary | ICD-10-CM | POA: Diagnosis not present

## 2023-05-30 DIAGNOSIS — E7800 Pure hypercholesterolemia, unspecified: Secondary | ICD-10-CM | POA: Diagnosis not present

## 2023-05-30 DIAGNOSIS — R7309 Other abnormal glucose: Secondary | ICD-10-CM | POA: Diagnosis not present

## 2023-05-30 DIAGNOSIS — R911 Solitary pulmonary nodule: Secondary | ICD-10-CM | POA: Diagnosis not present

## 2023-07-15 DIAGNOSIS — L729 Follicular cyst of the skin and subcutaneous tissue, unspecified: Secondary | ICD-10-CM | POA: Diagnosis not present

## 2023-07-17 DIAGNOSIS — L02212 Cutaneous abscess of back [any part, except buttock]: Secondary | ICD-10-CM | POA: Diagnosis not present

## 2023-07-17 DIAGNOSIS — L729 Follicular cyst of the skin and subcutaneous tissue, unspecified: Secondary | ICD-10-CM | POA: Diagnosis not present

## 2023-07-29 DIAGNOSIS — L02212 Cutaneous abscess of back [any part, except buttock]: Secondary | ICD-10-CM | POA: Diagnosis not present

## 2023-08-08 DIAGNOSIS — R3912 Poor urinary stream: Secondary | ICD-10-CM | POA: Diagnosis not present

## 2023-11-27 DIAGNOSIS — R7303 Prediabetes: Secondary | ICD-10-CM | POA: Diagnosis not present

## 2023-11-27 DIAGNOSIS — I10 Essential (primary) hypertension: Secondary | ICD-10-CM | POA: Diagnosis not present

## 2023-11-27 DIAGNOSIS — E7800 Pure hypercholesterolemia, unspecified: Secondary | ICD-10-CM | POA: Diagnosis not present

## 2023-11-29 DIAGNOSIS — E7800 Pure hypercholesterolemia, unspecified: Secondary | ICD-10-CM | POA: Diagnosis not present

## 2023-11-29 DIAGNOSIS — R7303 Prediabetes: Secondary | ICD-10-CM | POA: Diagnosis not present

## 2023-11-29 DIAGNOSIS — I10 Essential (primary) hypertension: Secondary | ICD-10-CM | POA: Diagnosis not present
